# Patient Record
Sex: Female | Born: 1935 | Race: White | Hispanic: No | Smoking: Never smoker
Health system: Southern US, Community
[De-identification: ages and names within clinical notes are randomized; demographics above are authoritative.]

## PROBLEM LIST (undated history)

## (undated) DIAGNOSIS — F329 Major depressive disorder, single episode, unspecified: Secondary | ICD-10-CM

## (undated) DIAGNOSIS — R002 Palpitations: Secondary | ICD-10-CM

## (undated) DIAGNOSIS — K219 Gastro-esophageal reflux disease without esophagitis: Secondary | ICD-10-CM

## (undated) DIAGNOSIS — H9190 Unspecified hearing loss, unspecified ear: Secondary | ICD-10-CM

## (undated) DIAGNOSIS — R062 Wheezing: Secondary | ICD-10-CM

## (undated) DIAGNOSIS — E785 Hyperlipidemia, unspecified: Secondary | ICD-10-CM

## (undated) DIAGNOSIS — M810 Age-related osteoporosis without current pathological fracture: Secondary | ICD-10-CM

## (undated) DIAGNOSIS — F32A Depression, unspecified: Secondary | ICD-10-CM

## (undated) DIAGNOSIS — R600 Localized edema: Secondary | ICD-10-CM

## (undated) DIAGNOSIS — M1990 Unspecified osteoarthritis, unspecified site: Secondary | ICD-10-CM

## (undated) DIAGNOSIS — J3089 Other allergic rhinitis: Secondary | ICD-10-CM

## (undated) DIAGNOSIS — E119 Type 2 diabetes mellitus without complications: Secondary | ICD-10-CM

## (undated) DIAGNOSIS — I10 Essential (primary) hypertension: Secondary | ICD-10-CM

## (undated) DIAGNOSIS — R05 Cough: Secondary | ICD-10-CM

## (undated) DIAGNOSIS — R059 Cough, unspecified: Secondary | ICD-10-CM

## (undated) DIAGNOSIS — R0981 Nasal congestion: Secondary | ICD-10-CM

## (undated) HISTORY — PX: TOTAL SHOULDER REPLACEMENT: SUR1217

## (undated) HISTORY — PX: BREAST BIOPSY: SHX20

## (undated) HISTORY — PX: REPLACEMENT TOTAL KNEE: SUR1224

## (undated) HISTORY — PX: CHOLECYSTECTOMY: SHX55

---
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
## 2016-03-01 ENCOUNTER — Encounter: Payer: Self-pay | Admitting: *Deleted

## 2016-03-01 ENCOUNTER — Inpatient Hospital Stay
Admit: 2016-03-01 | Discharge: 2016-03-01 | Disposition: A | Discharge status: Home / Self Care | Payer: Medicare Other | Attending: Internal Medicine | Admitting: Internal Medicine

## 2016-03-01 ENCOUNTER — Observation Stay
Admission: EM | Admit: 2016-03-01 | Discharge: 2016-03-04 | Disposition: A | Discharge status: Home / Self Care | Payer: Medicare Other | Attending: Internal Medicine | Admitting: Internal Medicine

## 2016-03-01 DIAGNOSIS — A084 Viral intestinal infection, unspecified: Principal | ICD-10-CM | POA: Diagnosis present

## 2016-03-01 DIAGNOSIS — I081 Rheumatic disorders of both mitral and tricuspid valves: Secondary | ICD-10-CM | POA: Insufficient documentation

## 2016-03-01 DIAGNOSIS — I951 Orthostatic hypotension: Secondary | ICD-10-CM | POA: Diagnosis not present

## 2016-03-01 DIAGNOSIS — Z96651 Presence of right artificial knee joint: Secondary | ICD-10-CM | POA: Insufficient documentation

## 2016-03-01 DIAGNOSIS — M1990 Unspecified osteoarthritis, unspecified site: Secondary | ICD-10-CM | POA: Diagnosis not present

## 2016-03-01 DIAGNOSIS — E872 Acidosis, unspecified: Secondary | ICD-10-CM

## 2016-03-01 DIAGNOSIS — N179 Acute kidney failure, unspecified: Secondary | ICD-10-CM

## 2016-03-01 DIAGNOSIS — I10 Essential (primary) hypertension: Secondary | ICD-10-CM | POA: Diagnosis not present

## 2016-03-01 DIAGNOSIS — F419 Anxiety disorder, unspecified: Secondary | ICD-10-CM | POA: Insufficient documentation

## 2016-03-01 DIAGNOSIS — Z885 Allergy status to narcotic agent status: Secondary | ICD-10-CM | POA: Diagnosis not present

## 2016-03-01 DIAGNOSIS — R112 Nausea with vomiting, unspecified: Secondary | ICD-10-CM

## 2016-03-01 DIAGNOSIS — N390 Urinary tract infection, site not specified: Secondary | ICD-10-CM | POA: Diagnosis not present

## 2016-03-01 DIAGNOSIS — Z7982 Long term (current) use of aspirin: Secondary | ICD-10-CM | POA: Insufficient documentation

## 2016-03-01 DIAGNOSIS — K219 Gastro-esophageal reflux disease without esophagitis: Secondary | ICD-10-CM | POA: Diagnosis not present

## 2016-03-01 DIAGNOSIS — F329 Major depressive disorder, single episode, unspecified: Secondary | ICD-10-CM | POA: Diagnosis not present

## 2016-03-01 DIAGNOSIS — E785 Hyperlipidemia, unspecified: Secondary | ICD-10-CM | POA: Diagnosis not present

## 2016-03-01 DIAGNOSIS — B961 Klebsiella pneumoniae [K. pneumoniae] as the cause of diseases classified elsewhere: Secondary | ICD-10-CM | POA: Insufficient documentation

## 2016-03-01 DIAGNOSIS — R197 Diarrhea, unspecified: Secondary | ICD-10-CM

## 2016-03-01 DIAGNOSIS — Z96611 Presence of right artificial shoulder joint: Secondary | ICD-10-CM | POA: Diagnosis not present

## 2016-03-01 DIAGNOSIS — E860 Dehydration: Secondary | ICD-10-CM | POA: Insufficient documentation

## 2016-03-01 DIAGNOSIS — E876 Hypokalemia: Secondary | ICD-10-CM | POA: Insufficient documentation

## 2016-03-01 DIAGNOSIS — E538 Deficiency of other specified B group vitamins: Secondary | ICD-10-CM | POA: Diagnosis not present

## 2016-03-01 DIAGNOSIS — I959 Hypotension, unspecified: Secondary | ICD-10-CM

## 2016-03-01 HISTORY — DX: Essential (primary) hypertension: I10

## 2016-03-01 LAB — URINALYSIS COMPLETE WITH MICROSCOPIC (ARMC ONLY)
BILIRUBIN URINE: NEGATIVE
GLUCOSE, UA: NEGATIVE mg/dL
Hgb urine dipstick: NEGATIVE
Ketones, ur: NEGATIVE mg/dL
Nitrite: NEGATIVE
Protein, ur: 30 mg/dL — AB
Specific Gravity, Urine: 1.021 (ref 1.005–1.030)
pH: 5 (ref 5.0–8.0)

## 2016-03-01 LAB — COMPREHENSIVE METABOLIC PANEL
ALBUMIN: 3.7 g/dL (ref 3.5–5.0)
ALK PHOS: 44 U/L (ref 38–126)
ALT: 17 U/L (ref 14–54)
AST: 19 U/L (ref 15–41)
Anion gap: 13 (ref 5–15)
BILIRUBIN TOTAL: 0.2 mg/dL — AB (ref 0.3–1.2)
BUN: 39 mg/dL — ABNORMAL HIGH (ref 6–20)
CALCIUM: 9.2 mg/dL (ref 8.9–10.3)
CO2: 20 mmol/L — AB (ref 22–32)
CREATININE: 1.95 mg/dL — AB (ref 0.44–1.00)
Chloride: 106 mmol/L (ref 101–111)
GFR calc non Af Amer: 23 mL/min — ABNORMAL LOW (ref >60)
GFR, EST AFRICAN AMERICAN: 27 mL/min — AB (ref >60)
GLUCOSE: 130 mg/dL — AB (ref 65–99)
Potassium: 4 mmol/L (ref 3.5–5.1)
SODIUM: 139 mmol/L (ref 135–145)
TOTAL PROTEIN: 7.1 g/dL (ref 6.5–8.1)

## 2016-03-01 LAB — GASTROINTESTINAL PANEL BY PCR, STOOL (REPLACES STOOL CULTURE)
Adenovirus F40/41: NOT DETECTED
Astrovirus: DETECTED — AB
CAMPYLOBACTER SPECIES: NOT DETECTED
Cryptosporidium: NOT DETECTED
Cyclospora cayetanensis: NOT DETECTED
ENTEROAGGREGATIVE E COLI (EAEC): NOT DETECTED
Entamoeba histolytica: NOT DETECTED
Enteropathogenic E coli (EPEC): NOT DETECTED
Enterotoxigenic E coli (ETEC): NOT DETECTED
GIARDIA LAMBLIA: NOT DETECTED
NOROVIRUS GI/GII: NOT DETECTED
Plesimonas shigelloides: NOT DETECTED
ROTAVIRUS A: NOT DETECTED
SALMONELLA SPECIES: NOT DETECTED
SHIGA LIKE TOXIN PRODUCING E COLI (STEC): NOT DETECTED
SHIGELLA/ENTEROINVASIVE E COLI (EIEC): NOT DETECTED
Sapovirus (I, II, IV, and V): NOT DETECTED
Vibrio cholerae: NOT DETECTED
Vibrio species: NOT DETECTED
Yersinia enterocolitica: NOT DETECTED

## 2016-03-01 LAB — C DIFFICILE QUICK SCREEN W PCR REFLEX
C DIFFICILE (CDIFF) INTERP: NOT DETECTED
C DIFFICILE (CDIFF) TOXIN: NEGATIVE
C Diff antigen: NEGATIVE

## 2016-03-01 LAB — MAGNESIUM: MAGNESIUM: 1.1 mg/dL — AB (ref 1.7–2.4)

## 2016-03-01 LAB — TROPONIN I

## 2016-03-01 LAB — CBC
HCT: 38.4 % (ref 35.0–47.0)
Hemoglobin: 13 g/dL (ref 12.0–16.0)
MCH: 30.8 pg (ref 26.0–34.0)
MCHC: 33.7 g/dL (ref 32.0–36.0)
MCV: 91.4 fL (ref 80.0–100.0)
PLATELETS: 363 10*3/uL (ref 150–440)
RBC: 4.21 MIL/uL (ref 3.80–5.20)
RDW: 15.8 % — ABNORMAL HIGH (ref 11.5–14.5)
WBC: 7.6 10*3/uL (ref 3.6–11.0)

## 2016-03-01 LAB — LIPASE, BLOOD: Lipase: 30 U/L (ref 11–51)

## 2016-03-01 MED ORDER — ASPIRIN EC 81 MG PO TBEC
81.0000 mg | DELAYED_RELEASE_TABLET | Freq: Every day | ORAL | Status: DC
  Administered 2016-03-02 – 2016-03-04 (×3): 81 mg via ORAL
  Filled 2016-03-01 (×3): qty 1

## 2016-03-01 MED ORDER — SODIUM CHLORIDE 0.9 % IV BOLUS (SEPSIS)
1000.0000 mL | Freq: Once | INTRAVENOUS | Status: AC
  Administered 2016-03-01: 1000 mL via INTRAVENOUS

## 2016-03-01 MED ORDER — ACETAMINOPHEN 650 MG RE SUPP: 650.0000 mg | Freq: Four times a day (QID) | RECTAL | Status: DC | PRN

## 2016-03-01 MED ORDER — ALUM & MAG HYDROXIDE-SIMETH 200-200-20 MG/5ML PO SUSP
30.0000 mL | ORAL | Status: DC | PRN
  Administered 2016-03-01 – 2016-03-02 (×3): 30 mL via ORAL
  Filled 2016-03-01 (×2): qty 30

## 2016-03-01 MED ORDER — PANTOPRAZOLE SODIUM 40 MG PO TBEC: 40.0000 mg | DELAYED_RELEASE_TABLET | Freq: Every day | ORAL | Status: DC

## 2016-03-01 MED ORDER — SODIUM CHLORIDE 0.9% FLUSH
3.0000 mL | Freq: Two times a day (BID) | INTRAVENOUS | Status: DC
Start: 2016-03-01 — End: 2016-03-04
  Administered 2016-03-01 – 2016-03-04 (×3): 3 mL via INTRAVENOUS

## 2016-03-01 MED ORDER — VITAMIN B-12 100 MCG PO TABS
100.0000 ug | ORAL_TABLET | Freq: Every day | ORAL | Status: DC
  Administered 2016-03-02 – 2016-03-04 (×3): 100 ug via ORAL
  Filled 2016-03-01 (×3): qty 1

## 2016-03-01 MED ORDER — ACETAMINOPHEN 325 MG PO TABS
ORAL_TABLET | ORAL | Status: AC
  Administered 2016-03-01: 650 mg
  Filled 2016-03-01: qty 2

## 2016-03-01 MED ORDER — ONDANSETRON 4 MG PO TBDP
4.0000 mg | ORAL_TABLET | Freq: Once | ORAL | Status: AC | PRN
  Administered 2016-03-01: 4 mg via ORAL

## 2016-03-01 MED ORDER — PANTOPRAZOLE SODIUM 40 MG PO TBEC
40.0000 mg | DELAYED_RELEASE_TABLET | Freq: Every day | ORAL | Status: DC
  Administered 2016-03-01 – 2016-03-04 (×4): 40 mg via ORAL
  Filled 2016-03-01 (×4): qty 1

## 2016-03-01 MED ORDER — ONDANSETRON HCL 4 MG/2ML IJ SOLN
4.0000 mg | Freq: Once | INTRAMUSCULAR | Status: AC
  Administered 2016-03-01: 4 mg via INTRAVENOUS
  Filled 2016-03-01: qty 2

## 2016-03-01 MED ORDER — SERTRALINE HCL 50 MG PO TABS
50.0000 mg | ORAL_TABLET | Freq: Two times a day (BID) | ORAL | Status: DC
  Administered 2016-03-01 – 2016-03-04 (×6): 50 mg via ORAL
  Filled 2016-03-01 (×6): qty 1

## 2016-03-01 MED ORDER — ONDANSETRON HCL 4 MG PO TABS: 4.0000 mg | ORAL_TABLET | Freq: Four times a day (QID) | ORAL | Status: DC | PRN

## 2016-03-01 MED ORDER — ALUM & MAG HYDROXIDE-SIMETH 200-200-20 MG/5ML PO SUSP
ORAL | Status: AC
  Filled 2016-03-01: qty 30

## 2016-03-01 MED ORDER — ONDANSETRON HCL 4 MG/2ML IJ SOLN
4.0000 mg | Freq: Four times a day (QID) | INTRAMUSCULAR | Status: DC | PRN
  Administered 2016-03-02: 4 mg via INTRAVENOUS
  Filled 2016-03-01: qty 2

## 2016-03-01 MED ORDER — SIMVASTATIN 40 MG PO TABS: 40.0000 mg | ORAL_TABLET | ORAL | Status: DC

## 2016-03-01 MED ORDER — ONDANSETRON 4 MG PO TBDP
ORAL_TABLET | ORAL | Status: AC
Start: 2016-03-01 — End: 2016-03-02
  Filled 2016-03-01: qty 1

## 2016-03-01 MED ORDER — ENOXAPARIN SODIUM 40 MG/0.4ML ~~LOC~~ SOLN
40.0000 mg | SUBCUTANEOUS | Status: DC
  Administered 2016-03-01: 40 mg via SUBCUTANEOUS
  Filled 2016-03-01: qty 0.4

## 2016-03-01 MED ORDER — TRAMADOL HCL 50 MG PO TABS
100.0000 mg | ORAL_TABLET | ORAL | Status: DC
  Administered 2016-03-01 – 2016-03-04 (×6): 100 mg via ORAL
  Filled 2016-03-01 (×6): qty 2

## 2016-03-01 MED ORDER — ACETAMINOPHEN 325 MG PO TABS
650.0000 mg | ORAL_TABLET | Freq: Four times a day (QID) | ORAL | Status: DC | PRN
  Administered 2016-03-02 – 2016-03-03 (×2): 650 mg via ORAL
  Filled 2016-03-01 (×2): qty 2

## 2016-03-01 MED ORDER — CALCIUM CARBONATE-VITAMIN D 500-200 MG-UNIT PO TABS
1.0000 | ORAL_TABLET | Freq: Every day | ORAL | Status: DC
  Administered 2016-03-02 – 2016-03-04 (×3): 1 via ORAL
  Filled 2016-03-01 (×3): qty 1

## 2016-03-01 MED ORDER — SODIUM CHLORIDE 0.9 % IV SOLN
INTRAVENOUS | Status: DC
  Administered 2016-03-01 – 2016-03-03 (×6): via INTRAVENOUS

## 2016-03-01 NOTE — ED Provider Notes (Signed)
[facility] Emergency Department Provider Note   ____________________________________________   First MD Initiated Contact with Patient 03/01/16 1318     (approximate)  I have reviewed the triage vital signs and the nursing notes.   HISTORY  Chief Complaint Emesis and Diarrhea   HPI Briana Cook is a 81 y.o. female With a history of hypertension was presenting with nausea vomiting and diarrhea over the past 3 days. She says that she has vomited about 2-3 times per day but is having about 10 episodes of diarrhea per day. She denies any blood in her vomitus or stool. Denies any pain. York Spaniel that she had a UTI several weeks ago and finished course of antibiotic but does not remove them the antibiotic.Felt very weak and felt that she was going to pass outtwice yesterday. Denies any chest pain or shortness of breath.   Past Medical History:  Diagnosis Date  . Hypertension     There are no active problems to display for this patient.   Past Surgical History:  Procedure Laterality Date  . REPLACEMENT TOTAL KNEE Right   . TOTAL SHOULDER REPLACEMENT Right     Prior to Admission medications   Medication Sig Start Date End Date Taking? Authorizing Provider  aspirin EC 81 MG tablet Take 81 mg by mouth daily.   Yes Historical Provider, MD  calcium-vitamin D (OSCAL WITH D) 500-200 MG-UNIT tablet Take 1 tablet by mouth daily with breakfast.   Yes Historical Provider, MD  Cyanocobalamin (B-12 PO) Take by mouth.   Yes Historical Provider, MD  lisinopril-hydrochlorothiazide (PRINZIDE,ZESTORETIC) 20-25 MG tablet Take 1 tablet by mouth 2 (two) times daily. 03/01/16  Yes Historical Provider, MD  metoprolol succinate (TOPROL-XL) 25 MG 24 hr tablet Take 1 tablet by mouth daily. 02/20/16  Yes Historical Provider, MD  omeprazole (PRILOSEC) 20 MG capsule Take 1 capsule by mouth daily. 02/20/16  Yes Historical Provider, MD  ondansetron (ZOFRAN) 4 MG tablet Take 1 tablet  by mouth 3 (three) times daily as needed. 01/19/16  Yes Historical Provider, MD  sertraline (ZOLOFT) 100 MG tablet Take 0.5 tablets by mouth 2 (two) times daily. 02/19/16  Yes Historical Provider, MD  simvastatin (ZOCOR) 40 MG tablet Take 1 tablet by mouth every morning. 02/20/16  Yes Historical Provider, MD  traMADol (ULTRAM) 50 MG tablet Take 2 tablets by mouth 2 (two) times daily in the am and at bedtime.. 01/26/16  Yes Historical Provider, MD    Allergies Oxycodone  History reviewed. No pertinent family history.  Social History Social History  Substance Use Topics  . Smoking status: Never Smoker  . Smokeless tobacco: Never Used  . Alcohol use Yes     Comment: occasionally    Review of Systems Constitutional: No fever/chills Eyes: No visual changes. ENT: No sore throat. Cardiovascular: Denies chest pain. Respiratory: Denies shortness of breath. Gastrointestinal: No abdominal pain.   No constipation. Genitourinary: Negative for dysuria. Musculoskeletal: Negative for back pain. Skin: Negative for rash. Neurological: Negative for headaches, focal weakness or numbness.  10-point ROS otherwise negative.  ____________________________________________   PHYSICAL EXAM:  VITAL SIGNS: ED Triage Vitals  Enc Vitals Group     BP 03/01/16 1212 (!) 87/44     Pulse Rate 03/01/16 1212 93     Resp 03/01/16 1212 16     Temp 03/01/16 1212 97.2 F (36.2 C)     Temp Source 03/01/16 1212 Oral     SpO2 03/01/16 1212 100 %  Weight 03/01/16 1214 189 lb (85.7 kg)     Height 03/01/16 1214 5\' 4"  (1.626 m)     Head Circumference --      Peak Flow --      Pain Score --      Pain Loc --      Pain Edu? --      Excl. in GC? --     Constitutional: Alert and oriented. Well appearing and in no acute distress. Eyes: Conjunctivae are normal. PERRL. EOMI. Head: Atraumatic. Nose: No congestion/rhinnorhea. Mouth/Throat: Mucous membranes are moist.   Neck: No stridor.   Cardiovascular:  Normal rate, regular rhythm. Grossly normal heart sounds.  Good peripheral circulation. Respiratory: Normal respiratory effort.  No retractions. Lungs CTAB. Gastrointestinal: Soft and nontender. No distention.  Musculoskeletal: No lower extremity tenderness nor edema.  No joint effusions. Neurologic:  Normal speech and language. No gross focal neurologic deficits are appreciated. No gait instability. Skin:  Skin is warm, dry and intact. No rash noted. Psychiatric: Mood and affect are normal. Speech and behavior are normal.  ____________________________________________   LABS (all labs ordered are listed, but only abnormal results are displayed)  Labs Reviewed  GASTROINTESTINAL PANEL BY PCR, STOOL (REPLACES STOOL CULTURE) - Abnormal; Notable for the following:       Result Value   Astrovirus DETECTED (*)    All other components within normal limits  COMPREHENSIVE METABOLIC PANEL - Abnormal; Notable for the following:    CO2 20 (*)    Glucose, Bld 130 (*)    BUN 39 (*)    Creatinine, Ser 1.95 (*)    Total Bilirubin 0.2 (*)    GFR calc non Af Amer 23 (*)    GFR calc Af Amer 27 (*)    All other components within normal limits  CBC - Abnormal; Notable for the following:    RDW 15.8 (*)    All other components within normal limits  URINALYSIS COMPLETEWITH MICROSCOPIC ([facility] ONLY) - Abnormal; Notable for the following:    Color, Urine AMBER (*)    APPearance HAZY (*)    Protein, ur 30 (*)    Leukocytes, UA 2+ (*)    Bacteria, UA MANY (*)    Squamous Epithelial / LPF 0-5 (*)    All other components within normal limits  C DIFFICILE QUICK SCREEN W PCR REFLEX  URINE CULTURE  LIPASE, BLOOD  TROPONIN I   ____________________________________________  EKG   ED ECG REPORT I, Arelia LongestSchaevitz,  Zakry Caso M, the attending physician, personally viewed and interpreted this ECG.   Date: 03/01/2016  EKG Time: 1340  Rate: 67  Rhythm: normal sinus rhythmwith a sinus arrhythmia  Axis: normal   Intervals:none  ST&T Change: diffuse T-wave inversions in 2, 3, aVF as well as V3 through V6. No ST elevation. No previous EKGs for comparison on the record.  ____________________________________________  RADIOLOGY   ____________________________________________   PROCEDURES  Procedure(s) performed:   Procedures  Critical Care performed:   ____________________________________________   INITIAL IMPRESSION / ASSESSMENT AND PLAN / ED COURSE  Pertinent labs & imaging results that were available during my care of the patient were reviewed by me and considered in my medical decision making (see chart for details).    Clinical Course  Comment By Time  Patient with improved symptoms and has not had any bouts of diarrhea since giving her stool sample. Also without any nausea or vomiting at this time. We'll by mouth challenge. Myrna Blazeravid Matthew Helayne Metsker, MD 10/25 1617   -----------------------------------------  5:11 PM on 03/01/2016 -----------------------------------------  Patient able to tolerate juice and a small amount of crackers. Discussed possible discharge to home with the patient and the patient's granddaughter but the grandmother says that she is the patient's primary care taker and does not think that she will be able to handle the responsibility of making sure that she is able to walk safely and also supervising her enough in case she has another near syncopal or syncopal episode. Patient also with possible signs of UTI on her urinalysis but without any clinical signs. She has been treated for a UTI several weeks ago. We'll send culture but will not treat at this time. I also discussed the likelihood that this would qualify as only an observation admission and the family understands and is willing to comply. The patient will be admitted to the hospital. Signed out to Dr. Allena Katz.  ____________________________________________   FINAL CLINICAL IMPRESSION(S) / ED  DIAGNOSES  Nausea vomiting and diarrhea. Acute kidney injury.    NEW MEDICATIONS STARTED DURING THIS VISIT:  New Prescriptions   No medications on file     Note:  This document was prepared using Dragon voice recognition software and may include unintentional dictation errors.    Myrna Blazer, MD 03/01/16 [phone]

## 2016-03-01 NOTE — Progress Notes (Signed)
Pt came to floor at 1844. VSS. Pt was oriented to room and safety plan. Granddaughter at bedside. Report was taken from Carolan ShiverLaura Cates, RN. PM night RN will take over care.

## 2016-03-01 NOTE — ED Notes (Signed)
Pt given crackers and juice by MD. Pt denies nausea or abdominal pain at this time.

## 2016-03-01 NOTE — ED Triage Notes (Signed)
Pt arrived to ED from home reports NVD since Monday. No fevers reported. Pt reports decreased appetite and weakness over the past 3 days. Pt reports being unable to Pedialyte down this morning. Pt in NAD at this time and has no neuro deficits noted.

## 2016-03-01 NOTE — H&P (Addendum)
[facility] [employer] at [facility]   PATIENT NAME: Briana Cook    MR#:  [id number]  DATE OF BIRTH:  Nov 15, 1934  DATE OF ADMISSION:  03/01/2016  PRIMARY CARE PHYSICIAN: No PCP Per Patient   REQUESTING/REFERRING PHYSICIAN:   CHIEF COMPLAINT:   Chief Complaint  Patient presents with  . Emesis  . Diarrhea    HISTORY OF PRESENT ILLNESS: Briana Cook  is a 81 y.o. female with a known history of Essential hypertension, vitamin B12 deficiency, gastroesophageal reflux disease, anxiety, depression, hyperlipidemia, who presents to the hospital with complaints of 3-4 day history of nausea, vomiting and diarrhea, weakness, dizziness. When the patient, she was doing well up until 3 or 4 days ago when she started having nausea and diarrhea, vomiting happened over the past 24 hours. Patient has been having loose stools about every 20 minutes. She had 2 syncopal episodes and falls earlier today and was brought to emergency room for further evaluation and treatment. In emergency room, she was noted to have acute renal failure with creatinine of 1.9, hypotension with systolic blood pressure of 90s on arrival, stool cultures revealed as to virus. Hospitalist services were contacted for admission  PAST MEDICAL HISTORY:   Past Medical History:  Diagnosis Date  . Hypertension     PAST SURGICAL HISTORY: Past Surgical History:  Procedure Laterality Date  . REPLACEMENT TOTAL KNEE Right   . TOTAL SHOULDER REPLACEMENT Right     SOCIAL HISTORY:  Social History  Substance Use Topics  . Smoking status: Never Smoker  . Smokeless tobacco: Never Used  . Alcohol use Yes     Comment: occasionally    FAMILY HISTORY: History reviewed. No pertinent family history.  DRUG ALLERGIES:  Allergies  Allergen Reactions  . Oxycodone Other (See Comments)    Pt reports "It makes me crazy"    Review of Systems  Constitutional: Negative for chills, fever and weight loss.  HENT: Negative  for congestion.   Eyes: Negative for blurred vision and double vision.  Respiratory: Negative for cough, sputum production, shortness of breath and wheezing.   Cardiovascular: Negative for chest pain, palpitations, orthopnea, leg swelling and PND.  Gastrointestinal: Positive for abdominal pain, diarrhea, nausea and vomiting. Negative for blood in stool and constipation.  Genitourinary: Positive for dysuria. Negative for frequency, hematuria and urgency.  Musculoskeletal: Negative for falls.  Neurological: Negative for dizziness, tremors, focal weakness and headaches.  Endo/Heme/Allergies: Does not bruise/bleed easily.  Psychiatric/Behavioral: Negative for depression. The patient does not have insomnia.     MEDICATIONS AT HOME:  Prior to Admission medications   Medication Sig Start Date End Date Taking? Authorizing Provider  aspirin EC 81 MG tablet Take 81 mg by mouth daily.   Yes Historical Provider, MD  calcium-vitamin D (OSCAL WITH D) 500-200 MG-UNIT tablet Take 1 tablet by mouth daily with breakfast.   Yes Historical Provider, MD  Cyanocobalamin (B-12 PO) Take by mouth.   Yes Historical Provider, MD  lisinopril-hydrochlorothiazide (PRINZIDE,ZESTORETIC) 20-25 MG tablet Take 1 tablet by mouth 2 (two) times daily. 03/01/16  Yes Historical Provider, MD  metoprolol succinate (TOPROL-XL) 25 MG 24 hr tablet Take 1 tablet by mouth daily. 02/20/16  Yes Historical Provider, MD  omeprazole (PRILOSEC) 20 MG capsule Take 1 capsule by mouth daily. 02/20/16  Yes Historical Provider, MD  ondansetron (ZOFRAN) 4 MG tablet Take 1 tablet by mouth 3 (three) times daily as needed. 01/19/16  Yes Historical Provider, MD  sertraline (ZOLOFT) 100 MG tablet  Take 0.5 tablets by mouth 2 (two) times daily. 02/19/16  Yes Historical Provider, MD  simvastatin (ZOCOR) 40 MG tablet Take 1 tablet by mouth every morning. 02/20/16  Yes Historical Provider, MD  traMADol (ULTRAM) 50 MG tablet Take 2 tablets by mouth 2 (two) times  daily in the am and at bedtime.. 01/26/16  Yes Historical Provider, MD      PHYSICAL EXAMINATION:   VITAL SIGNS: Blood pressure 122/68, pulse 75, temperature 97.2 F (36.2 C), temperature source Oral, resp. rate 13, height 5\' 4"  (1.626 m), weight 85.7 kg (189 lb), SpO2 94 %.  GENERAL:  81 y.o.-year-old patient lying in the bed with no acute distress. Heart hearing EYES: Pupils equal, round, reactive to light and accommodation. No scleral icterus. Extraocular muscles intact.  HEENT: Head atraumatic, normocephalic. Oropharynx and nasopharynx clear.  NECK:  Supple, no jugular venous distention. No thyroid enlargement, no tenderness.  LUNGS: Some diminished breath sounds bilaterally, no wheezing, rales,rhonchi or crepitation. No use of accessory muscles of respiration.  CARDIOVASCULAR: S1, S2 normal. No murmurs, rubs, or gallops.  ABDOMEN: Soft, Mild discomfort in the abdomen, mostly on the right side, but no rebound or guarding was noted, nondistended. Bowel sounds were diminished. No organomegaly or mass.  EXTREMITIES: No pedal edema, cyanosis, or clubbing.  NEUROLOGIC: Cranial nerves II through XII are intact. Muscle strength 5/5 in all extremities. Sensation intact. Gait not checked.  PSYCHIATRIC: The patient is alert and oriented x 3.  SKIN: No obvious rash, lesion, or ulcer.   LABORATORY PANEL:   CBC  Recent Labs Lab 03/01/16 1214  WBC 7.6  HGB 13.0  HCT 38.4  PLT 363  MCV 91.4  MCH 30.8  MCHC 33.7  RDW 15.8*   ------------------------------------------------------------------------------------------------------------------  Chemistries   Recent Labs Lab 03/01/16 1214  NA 139  K 4.0  CL 106  CO2 20*  GLUCOSE 130*  BUN 39*  CREATININE 1.95*  CALCIUM 9.2  AST 19  ALT 17  ALKPHOS 44  BILITOT 0.2*   ------------------------------------------------------------------------------------------------------------------  Cardiac Enzymes  Recent Labs Lab  03/01/16 1228  TROPONINI <0.03   ------------------------------------------------------------------------------------------------------------------  RADIOLOGY: No results found.  EKG: Orders placed or performed during the hospital encounter of 03/01/16  . ED EKG  . ED EKG   EKG in the ED showed sinus arrhythmia, rate of 67, abnormal R-wave progression, early transition, T inversion in multiple leads, including inferior and lateral   IMPRESSION AND PLAN:  Active Problems:   Viral enteritis   Hypotension   Acute renal failure (ARF) (HCC)   Acidosis   UTI (urinary tract infection)   Viral gastroenteritis #1. Acute viral gastroenteritis. Admit patient to medical floor, enteric precautions, initiate symptomatic therapy with IV fluids, antiemetics, clear liquid diet, follow clinically #2. Acute renal failure, suspend ACE inhibitor, diuretic, initiate IV fluids, follow creatinine in the morning, get renal ultrasound  #3. Hypotension, continue IV fluids, hold blood pressure medications #4. Pyuria, questionable urinary tract infection, patient received 2 rounds of antibiotic therapy recently, get urine cultures and initiate antibiotics when culture results are known, patient is relatively asymptomatic #5. Abnormal EKG, syncope episodes, get echocardiogram, holding metoprolol until blood pressure improves  All the records are reviewed and case discussed with ED provider. Management plans discussed with the patient, family and they are in agreement.  CODE STATUS: Code Status History    This patient does not have a recorded code status. Please follow your organizational policy for patients in this situation.    Advance Directive  Documentation   Flowsheet Row Most Recent Value  Type of Advance Directive  Healthcare Power of Attorney [Machael Laufer]  Pre-existing out of facility DNR order (yellow form or pink MOST form)  No data  "MOST" Form in Place?  No data       TOTAL TIME  TAKING CARE OF THIS PATIENT: 50 minutes.    Katharina CaperVAICKUTE,Jonmichael Beadnell M.D on 03/01/2016 at 5:39 PM  Between 7am to 6pm - Pager - [phone] After 6pm go to [email or web address] - password [email or web address] [facility]  [facility]  Office  [phone]  CC: Primary care physician; No PCP Per Patient

## 2016-03-02 ENCOUNTER — Observation Stay: Payer: Medicare Other

## 2016-03-02 LAB — ECHOCARDIOGRAM COMPLETE
HEIGHTINCHES: 64 in
Weight: 3024 oz

## 2016-03-02 LAB — TROPONIN I: Troponin I: <0.03 ng/mL (ref <0.03)

## 2016-03-02 LAB — HEMOGLOBIN A1C
Hgb A1c MFr Bld: 5.7 % — ABNORMAL HIGH (ref 4.8–5.6)
MEAN PLASMA GLUCOSE: 117 mg/dL

## 2016-03-02 LAB — CBC
HEMATOCRIT: 32.3 % — AB (ref 35.0–47.0)
HEMOGLOBIN: 10.8 g/dL — AB (ref 12.0–16.0)
MCH: 30.9 pg (ref 26.0–34.0)
MCHC: 33.5 g/dL (ref 32.0–36.0)
MCV: 92.2 fL (ref 80.0–100.0)
Platelets: 245 10*3/uL (ref 150–440)
RBC: 3.5 MIL/uL — ABNORMAL LOW (ref 3.80–5.20)
RDW: 15.9 % — ABNORMAL HIGH (ref 11.5–14.5)
WBC: 5.3 10*3/uL (ref 3.6–11.0)

## 2016-03-02 LAB — BASIC METABOLIC PANEL
ANION GAP: 8 (ref 5–15)
BUN: 34 mg/dL — ABNORMAL HIGH (ref 6–20)
CO2: 19 mmol/L — ABNORMAL LOW (ref 22–32)
Calcium: 7.9 mg/dL — ABNORMAL LOW (ref 8.9–10.3)
Chloride: 112 mmol/L — ABNORMAL HIGH (ref 101–111)
Creatinine, Ser: 1.3 mg/dL — ABNORMAL HIGH (ref 0.44–1.00)
GFR calc Af Amer: 44 mL/min — ABNORMAL LOW (ref >60)
GFR, EST NON AFRICAN AMERICAN: 38 mL/min — AB (ref >60)
Glucose, Bld: 97 mg/dL (ref 65–99)
POTASSIUM: 3.4 mmol/L — AB (ref 3.5–5.1)
SODIUM: 139 mmol/L (ref 135–145)

## 2016-03-02 LAB — GLUCOSE, CAPILLARY: Glucose-Capillary: 93 mg/dL (ref 65–99)

## 2016-03-02 MED ORDER — ENSURE ENLIVE PO LIQD
237.0000 mL | Freq: Every day | ORAL | Status: DC
  Administered 2016-03-03: 237 mL via ORAL

## 2016-03-02 MED ORDER — LOPERAMIDE HCL 2 MG PO CAPS
2.0000 mg | ORAL_CAPSULE | Freq: Four times a day (QID) | ORAL | Status: DC | PRN
  Administered 2016-03-02 – 2016-03-03 (×4): 2 mg via ORAL
  Filled 2016-03-02 (×4): qty 1

## 2016-03-02 MED ORDER — SIMVASTATIN 40 MG PO TABS
40.0000 mg | ORAL_TABLET | Freq: Every morning | ORAL | Status: DC
  Administered 2016-03-02 – 2016-03-04 (×3): 40 mg via ORAL
  Filled 2016-03-02 (×3): qty 1

## 2016-03-02 MED ORDER — ENOXAPARIN SODIUM 40 MG/0.4ML ~~LOC~~ SOLN
40.0000 mg | SUBCUTANEOUS | Status: DC
  Administered 2016-03-02 – 2016-03-03 (×2): 40 mg via SUBCUTANEOUS
  Filled 2016-03-02 (×2): qty 0.4

## 2016-03-02 MED ORDER — ENOXAPARIN SODIUM 30 MG/0.3ML ~~LOC~~ SOLN: 30.0000 mg | SUBCUTANEOUS | Status: DC

## 2016-03-02 NOTE — Progress Notes (Signed)
Initial Nutrition Assessment  DOCUMENTATION CODES:   Obesity unspecified  INTERVENTION:  -Discussed nutritional supplementation until appetite improves; pt agreeable. Recommend EnsureEnlive daily.   NUTRITION DIAGNOSIS:   Inadequate oral intake related to altered GI function, acute illness as evidenced by per patient/family report.  GOAL:   Patient will meet greater than or equal to 90% of their needs  MONITOR:   Diet advancement, PO intake, Supplement acceptance, Labs, Weight trends  REASON FOR ASSESSMENT:   Malnutrition Screening Tool    ASSESSMENT:    81 yo female admitted with N/V/D with viral gastroenteritis, ARF  Pt reports good appetite up until Sunday evening when diarrhea started. Pt with few episodes of N/V as well. Minimal po intake since Sunday and whatever she did eat just "ran right through." Pt reports no wt loss.   Pt feeling much better today. No N/V today +1 loose stool. Tolerated CL diet well at Breakfast and Lunch, diet advanced to FL.  Nutrition-Focused physical exam completed. Findings are WDL for fat depletion, muscle depletion, and edema.    Past Medical History:  Diagnosis Date  . Hypertension      Diet Order:  Diet full liquid Room service appropriate? Yes; Fluid consistency: Thin  Skin:  Reviewed, no issues  Last BM:  10/26 diarrhea improving   Labs: potassium 3.4, magnesium 1.1  Meds: NS at 125 ml/hr  Height:   Ht Readings from Last 1 Encounters:  03/01/16 5\' 4"  (1.626 m)    Weight:   Wt Readings from Last 1 Encounters:  03/01/16 189 lb (85.7 kg)    Ideal Body Weight:  54.5 kg  BMI:  Body mass index is 32.44 kg/m.  Estimated Nutritional Needs:   Kcal:  1600-1800 kcals  Protein:  86-95 g  Fluid:  >/= 1.8 L  EDUCATION NEEDS:   No education needs identified at this time  Romelle StarcherCate Jorrell Kuster MS, RD, LDN [phone] Pager  [phone] Weekend/On-Call Pager

## 2016-03-02 NOTE — Care Management Obs Status (Signed)
MEDICARE OBSERVATION STATUS NOTIFICATION   Patient Details  Name: Briana Cook MRN: [id number] Date of Birth: 08-09-1934   Medicare Observation Status Notification Given:  Yes No patient signature because the patient is in Isolation    [name], RN 03/02/2016, 9:12 AM

## 2016-03-02 NOTE — Progress Notes (Signed)
Anticoagulation monitoring(Lovenox):  81yo  female ordered Lovenox for DVT prevention. Currently ordered 30mg  q24h for renal adjustment.  Filed Weights   03/01/16 1214  Weight: 189 lb (85.7 kg)    Lab Results  Component Value Date   CREATININE 1.30 (H) 03/02/2016   CREATININE 1.95 (H) 03/01/2016   Estimated Creatinine Clearance: 37.2 mL/min (by C-G formula based on SCr of 1.3 mg/dL (H)). Hemoglobin & Hematocrit     Component Value Date/Time   HGB 10.8 (L) 03/02/2016 0720   HCT 32.3 (L) 03/02/2016 0720   Renal function has improved, est. CrCl =37.2 ml/min  Per Protocol for Patient with estCrcl > 30 ml/min and BMI < 40, will transition to Lovenox 40 mg Q24h.     Clovia CuffLisa Tiare Rohlman, PharmD, BCPS 03/02/2016 9:54 AM

## 2016-03-02 NOTE — Progress Notes (Signed)
[facility] - [place] at [facility]   PATIENT NAME: Briana Cook [place]    MR#:  [id number]  DATE OF BIRTH:  1934-12-06  SUBJECTIVE:   Patient here due to nausea vomiting and diarrhea noted to have acute viral gastroenteritis. Also noted to be in acute kidney injury. Still having some diarrhea but improved. Overall feels better since yesterday.  REVIEW OF SYSTEMS:    Review of Systems  Constitutional: Negative for chills and fever.  HENT: Negative for congestion and tinnitus.   Eyes: Negative for blurred vision and double vision.  Respiratory: Negative for cough, shortness of breath and wheezing.   Cardiovascular: Negative for chest pain, orthopnea and PND.  Gastrointestinal: Positive for diarrhea. Negative for abdominal pain, nausea and vomiting.  Genitourinary: Negative for dysuria and hematuria.  Neurological: Positive for weakness (generalized). Negative for dizziness, sensory change and focal weakness.  All other systems reviewed and are negative.   Nutrition: Full liquids Tolerating Diet: Yes Tolerating PT: Await eval.      DRUG ALLERGIES:   Allergies  Allergen Reactions  . Oxycodone Other (See Comments)    Pt reports "It makes me crazy"    VITALS:  Blood pressure (!) 155/65, pulse 86, temperature 97.8 F (36.6 C), temperature source Oral, resp. rate 18, height 5\' 4"  (1.626 m), weight 85.7 kg (189 lb), SpO2 96 %.  PHYSICAL EXAMINATION:   Physical Exam  GENERAL:  81 y.o.-year-old patient lying in the bed in no acute distress.  EYES: Pupils equal, round, reactive to light and accommodation. No scleral icterus. Extraocular muscles intact.  HEENT: Head atraumatic, normocephalic. Oropharynx and nasopharynx clear.  NECK:  Supple, no jugular venous distention. No thyroid enlargement, no tenderness.  LUNGS: Normal breath sounds bilaterally, no wheezing, rales, rhonchi. No use of accessory muscles of respiration.  CARDIOVASCULAR: S1, S2 normal. No murmurs,  rubs, or gallops.  ABDOMEN: Soft, nontender, nondistended. Bowel sounds present. No organomegaly or mass.  EXTREMITIES: No cyanosis, clubbing or edema b/l.    NEUROLOGIC: Cranial nerves II through XII are intact. No focal Motor or sensory deficits b/l.   PSYCHIATRIC: The patient is alert and oriented x 3.  SKIN: No obvious rash, lesion, or ulcer.    LABORATORY PANEL:   CBC  Recent Labs Lab 03/02/16 0720  WBC 5.3  HGB 10.8*  HCT 32.3*  PLT 245   ------------------------------------------------------------------------------------------------------------------  Chemistries   Recent Labs Lab 03/01/16 1214 03/01/16 1735 03/02/16 0720  NA 139  --  139  K 4.0  --  3.4*  CL 106  --  112*  CO2 20*  --  19*  GLUCOSE 130*  --  97  BUN 39*  --  34*  CREATININE 1.95*  --  1.30*  CALCIUM 9.2  --  7.9*  MG  --  1.1*  --   AST 19  --   --   ALT 17  --   --   ALKPHOS 44  --   --   BILITOT 0.2*  --   --    ------------------------------------------------------------------------------------------------------------------  Cardiac Enzymes  Recent Labs Lab 03/02/16 0720  TROPONINI <0.03   ------------------------------------------------------------------------------------------------------------------  RADIOLOGY:  No results found.   ASSESSMENT AND PLAN:   81 year old female with past medical history of essential hypertension, hyperlipidemia, GERD, depression, osteoarthritis who presented to the hospital due to nausea vomiting diarrhea. Next  1. Acute gastroenteritis-viral in nature. Patient's gastrointestinal PCR is + for astrovirus.  - cont. Supportive care w/ IV fluids, anti-emetics, started on Imodium  today.  - tolerating clear liquids and will advance to full liquid diet.   2. Acute kidney injury-secondary to the nausea vomiting and diarrhea. -Continue IV fluids and creatinines improving.  3. GERD-continue Protonix.  4. Hyperlipidemia-continue Zocor.  5.  Anxiety/depression-continue Zoloft.  6. Hypokalemia-secondary to diarrhea. Continue supplementing. Repeat Level in the morning.    All the records are reviewed and case discussed with Care Management/Social Worker. Management plans discussed with the patient, family and they are in agreement.  CODE STATUS: Full   DVT Prophylaxis: Lovenox  TOTAL TIME TAKING CARE OF THIS PATIENT: 30 minutes.   POSSIBLE D/C IN 1-2 DAYS, DEPENDING ON CLINICAL CONDITION.   Houston Siren M.D on 03/02/2016 at 12:40 PM  Between 7am to 6pm - Pager - [phone]  After 6pm go to [email or web address] - Scientist, research (life sciences) [place] [facility]  Office  [phone]  CC: Primary care physician; No PCP Per Patient

## 2016-03-03 LAB — BASIC METABOLIC PANEL
ANION GAP: 7 (ref 5–15)
BUN: 18 mg/dL (ref 6–20)
CO2: 21 mmol/L — ABNORMAL LOW (ref 22–32)
CREATININE: 0.86 mg/dL (ref 0.44–1.00)
Calcium: 8.1 mg/dL — ABNORMAL LOW (ref 8.9–10.3)
Chloride: 111 mmol/L (ref 101–111)
GFR calc non Af Amer: >60 mL/min (ref >60)
Glucose, Bld: 103 mg/dL — ABNORMAL HIGH (ref 65–99)
POTASSIUM: 3.4 mmol/L — AB (ref 3.5–5.1)
SODIUM: 139 mmol/L (ref 135–145)

## 2016-03-03 LAB — GLUCOSE, CAPILLARY: Glucose-Capillary: 101 mg/dL — ABNORMAL HIGH (ref 65–99)

## 2016-03-03 MED ORDER — POTASSIUM CHLORIDE CRYS ER 20 MEQ PO TBCR
40.0000 meq | EXTENDED_RELEASE_TABLET | Freq: Once | ORAL | Status: AC
  Administered 2016-03-03: 40 meq via ORAL
  Filled 2016-03-03: qty 2

## 2016-03-03 NOTE — Progress Notes (Signed)
[facility] - [place] at [facility]   PATIENT NAME: Trachelle Low    MR#:  [id number]  DATE OF BIRTH:  1934-08-16  SUBJECTIVE:   Patient is here due to diarrhea from viral gastroenteritis. Continues to have ongoing diarrhea. No nausea or vomiting. Remains orthostatic. Family at bedside today.  REVIEW OF SYSTEMS:    Review of Systems  Constitutional: Negative for chills and fever.  HENT: Negative for congestion and tinnitus.   Eyes: Negative for blurred vision and double vision.  Respiratory: Negative for cough, shortness of breath and wheezing.   Cardiovascular: Negative for chest pain, orthopnea and PND.  Gastrointestinal: Positive for diarrhea. Negative for abdominal pain, nausea and vomiting.  Genitourinary: Negative for dysuria and hematuria.  Neurological: Positive for weakness (generalized). Negative for dizziness, sensory change and focal weakness.  All other systems reviewed and are negative.   Nutrition: Soft diet Tolerating Diet: Yes Tolerating PT: Await eval.     DRUG ALLERGIES:   Allergies  Allergen Reactions  . Oxycodone Other (See Comments)    Pt reports "It makes me crazy"    VITALS:  Blood pressure (!) 98/54, pulse (!) 102, temperature 98.2 F (36.8 C), temperature source Oral, resp. rate 17, height 5\' 4"  (1.626 m), weight 82.3 kg (181 lb 8 oz), SpO2 98 %.  PHYSICAL EXAMINATION:   Physical Exam  GENERAL:  81 y.o.-year-old patient lying in the bed in no acute distress.  EYES: Pupils equal, round, reactive to light and accommodation. No scleral icterus. Extraocular muscles intact.  HEENT: Head atraumatic, normocephalic. Oropharynx and nasopharynx clear.  NECK:  Supple, no jugular venous distention. No thyroid enlargement, no tenderness.  LUNGS: Normal breath sounds bilaterally, no wheezing, rales, rhonchi. No use of accessory muscles of respiration.  CARDIOVASCULAR: S1, S2 normal. II/VI SEM at RSB, No rubs, or gallops.  ABDOMEN: Soft,  nontender, nondistended. Bowel sounds present. No organomegaly or mass.  EXTREMITIES: No cyanosis, clubbing or edema b/l.    NEUROLOGIC: Cranial nerves II through XII are intact. No focal Motor or sensory deficits b/l.   PSYCHIATRIC: The patient is alert and oriented x 3.  SKIN: No obvious rash, lesion, or ulcer.    LABORATORY PANEL:   CBC  Recent Labs Lab 03/02/16 0720  WBC 5.3  HGB 10.8*  HCT 32.3*  PLT 245   ------------------------------------------------------------------------------------------------------------------  Chemistries   Recent Labs Lab 03/01/16 1214 03/01/16 1735  03/03/16 0912  NA 139  --   < > 139  K 4.0  --   < > 3.4*  CL 106  --   < > 111  CO2 20*  --   < > 21*  GLUCOSE 130*  --   < > 103*  BUN 39*  --   < > 18  CREATININE 1.95*  --   < > 0.86  CALCIUM 9.2  --   < > 8.1*  MG  --  1.1*  --   --   AST 19  --   --   --   ALT 17  --   --   --   ALKPHOS 44  --   --   --   BILITOT 0.2*  --   --   --   < > = values in this interval not displayed. ------------------------------------------------------------------------------------------------------------------  Cardiac Enzymes  Recent Labs Lab 03/02/16 0720  TROPONINI <0.03   ------------------------------------------------------------------------------------------------------------------  RADIOLOGY:  US Renal  Result Date: 03/02/2016 CLINICAL DATA:  81 y/o  F; acute renal  failure. EXAM: RENAL / URINARY TRACT ULTRASOUND COMPLETE COMPARISON:  None. FINDINGS: Right Kidney: Length: 10.5 cm. Normal echogenicity. No hydronephrosis. Mildly lobulated contour. Well-circumscribed hypoechoic 2.0 x 2.2 x 2 0 cm lower pole lesion with enhanced through transmission compatible with simple cyst. Left Kidney: Length: 9.3 cm. Echogenicity within normal limits. No mass or hydronephrosis visualized. Mildly lobulated contour. Bladder: Appears normal for degree of bladder distention. IMPRESSION: No acute process  identified.  Unremarkable renal ultrasound. Electronically Signed   By: Mitzi HansenLance  Furusawa-Stratton M.D.   On: 03/02/2016 13:41     ASSESSMENT AND PLAN:   81 year old female with past medical history of essential hypertension, hyperlipidemia, GERD, depression, osteoarthritis who presented to the hospital due to nausea vomiting diarrhea. Next  1. Acute gastroenteritis-viral in nature. Patient's gastrointestinal PCR is + for astrovirus.  - cont. Supportive care w/ IV fluids, anti-emetics, Imodium.   - tolerating full liquids and will advance to soft diet and monitor.   2. Orthostatic Hypotension - due to dehydration and ongoing diarrhea.  - cont. IV fluids and will repeat in a.m.   3. Acute kidney injury-secondary to the nausea vomiting and diarrhea. - improved and normalized w/ IV fluids.    3. GERD-continue Protonix.  4. Hyperlipidemia-continue Zocor.  5. Anxiety/depression-continue Zoloft.  6. Hypokalemia-secondary to diarrhea.  - cont. To supplement and will repeat in a.m.    All the records are reviewed and case discussed with Care Management/Social Worker. Management plans discussed with the patient, family and they are in agreement.  CODE STATUS: Full   DVT Prophylaxis: Lovenox  TOTAL TIME TAKING CARE OF THIS PATIENT: 25 minutes.   POSSIBLE D/C IN 1-2 DAYS, DEPENDING ON CLINICAL CONDITION.   Houston SirenSAINANI,Baird Polinski J M.D on 03/03/2016 at 2:13 PM  Between 7am to 6pm - Pager - [phone]  After 6pm go to [email or web address] - Scientist, research (life sciences)password [email or web address]  [facility] [place] [facility]  Office  [phone]  CC: Primary care physician; No PCP Per Patient

## 2016-03-04 LAB — GLUCOSE, CAPILLARY: Glucose-Capillary: 90 mg/dL (ref 65–99)

## 2016-03-04 LAB — BASIC METABOLIC PANEL
Anion gap: 6 (ref 5–15)
BUN: 13 mg/dL (ref 6–20)
CO2: 20 mmol/L — ABNORMAL LOW (ref 22–32)
Calcium: 8.1 mg/dL — ABNORMAL LOW (ref 8.9–10.3)
Chloride: 113 mmol/L — ABNORMAL HIGH (ref 101–111)
Creatinine, Ser: 0.85 mg/dL (ref 0.44–1.00)
GFR calc Af Amer: >60 mL/min (ref >60)
GFR calc non Af Amer: >60 mL/min (ref >60)
Glucose, Bld: 97 mg/dL (ref 65–99)
Potassium: 3.5 mmol/L (ref 3.5–5.1)
Sodium: 139 mmol/L (ref 135–145)

## 2016-03-04 LAB — URINE CULTURE

## 2016-03-04 MED ORDER — CEPHALEXIN 250 MG PO CAPS: 250.0000 mg | ORAL_CAPSULE | Freq: Three times a day (TID) | ORAL | 0 refills | Status: DC

## 2016-03-04 MED ORDER — SODIUM CHLORIDE 0.9 % IV BOLUS (SEPSIS)
1000.0000 mL | Freq: Once | INTRAVENOUS | Status: AC
  Administered 2016-03-04: 1000 mL via INTRAVENOUS

## 2016-03-04 NOTE — Progress Notes (Signed)
Spoke with Dr. Winona LegatoVaickute and gave her the patient's orthostatic vitals. Per MD okay for patinet to be discharged at this time.

## 2016-03-04 NOTE — Evaluation (Signed)
Physical Therapy Evaluation Patient Details Name: Briana ReichmannMary Joann Cook MRN: [id number] DOB: [birth year] Today's Date: 03/04/2016   History of Present Illness  Patient is a pleasant 81 y/o female that presents with multiple days of vomiting, diarrhea, and feeling of weakness. She has had 2 syncopal episodes, though no overt falls. She has been orthostatic since her admission.   Clinical Impression  Patient reported to have multiple episodes of nausea, diarrhea, vomiting, resulting in feeling weak. She demonstrates fairly significant orthostatic drop in BP initially with standing, though asymptomatic. Patient demonstrates increase in BP with increased time in standing, again with no symptoms. She is able to ambulate household distance with RW with good speed, no overt loss of balance. Patient appears to be regaining strength in LEs, though given her new reliance on RW (had been fairly independent prior to this admission) and orthostatic values would recommend HHPT after discharge. She has chronic R shoulder impairments, though she will be living with daughter who can assist with ADLs as needed.     Follow Up Recommendations Home health PT    Equipment Recommendations  Rolling walker with 5" wheels    Recommendations for Other Services       Precautions / Restrictions Precautions Precautions: Fall Restrictions Weight Bearing Restrictions: No      Mobility  Bed Mobility Overal bed mobility: Independent             General bed mobility comments: No deficits noted.   Transfers Overall transfer level: Modified independent Equipment used: Rolling walker (2 wheeled)             General transfer comment: No loss of balance, no complaints of dizziness.   Ambulation/Gait Ambulation/Gait assistance: Modified independent (Device/Increase time) Ambulation Distance (Feet): 200 Feet Assistive device: Rolling walker (2 wheeled) Gait Pattern/deviations: WFL(Within Functional Limits)    Gait velocity interpretation: at or above normal speed for age/gender General Gait Details: Good gait speed, no scissoring or LOB with RW. In brief bout without RW, mild loss of balance to her L side with decreased stride length from when using RW.   Stairs            Wheelchair Mobility    Modified Rankin (Stroke Patients Only)       Balance Overall balance assessment: Modified Independent;No apparent balance deficits (not formally assessed)                                           Pertinent Vitals/Pain Pain Assessment: No/denies pain    Home Living Family/patient expects to be discharged to:: Private residence Living Arrangements: Other relatives Available Help at Discharge: Family;Available PRN/intermittently Type of Home: House Home Access: Stairs to enter   Entrance Stairs-Number of Steps: 2 Home Layout: Two level;Able to live on main level with bedroom/bathroom Home Equipment: Dan HumphreysWalker - 2 wheels;Walker - standard      Prior Function Level of Independence: Independent with assistive device(s)         Comments: Patient alternates between use of std walker and no A/D, though per daughter they have RW at home they will begin to have her use.      Hand Dominance        Extremity/Trunk Assessment   Upper Extremity Assessment: RUE deficits/detail RUE Deficits / Details: Unable to actively flex RUE beyond ~70 degrees due to degenerative changes, chronic.  Lower Extremity Assessment: Overall WFL for tasks assessed         Communication   Communication: No difficulties  Cognition Arousal/Alertness: Awake/alert Behavior During Therapy: WFL for tasks assessed/performed Overall Cognitive Status: Within Functional Limits for tasks assessed                      General Comments      Exercises     Assessment/Plan    PT Assessment Patient needs continued PT services  PT Problem List Decreased strength;Decreased  range of motion;Decreased activity tolerance;Decreased mobility;Decreased knowledge of precautions;Decreased knowledge of use of DME;Cardiopulmonary status limiting activity          PT Treatment Interventions DME instruction;Gait training;Stair training;Balance training;Therapeutic exercise;Therapeutic activities;Patient/family education    PT Goals (Current goals can be found in the Care Plan section)  Acute Rehab PT Goals Patient Stated Goal: To return home safely  PT Goal Formulation: With patient/family Time For Goal Achievement: 03/18/16 Potential to Achieve Goals: Good    Frequency Min 2X/week   Barriers to discharge        Co-evaluation               End of Session Equipment Utilized During Treatment: Gait belt Activity Tolerance: Patient tolerated treatment well Patient left: in bed;with bed alarm set;with call bell/phone within reach Nurse Communication: Mobility status    Functional Assessment Tool Used: Clinical reasoning Functional Limitation: Mobility: Walking and moving around Mobility: Walking and Moving Around Current Status ([id number](G8978): At least 1 percent but less than 20 percent impaired, limited or restricted Mobility: Walking and Moving Around Goal Status [phone](G8979): At least 1 percent but less than 20 percent impaired, limited or restricted    Time: 0917-0938 PT Time Calculation (min) (ACUTE ONLY): 21 min   Charges:   PT Evaluation $PT Eval Moderate Complexity: 1 Procedure     PT G Codes:   PT G-Codes **NOT FOR INPATIENT CLASS** Functional Assessment Tool Used: Clinical reasoning Functional Limitation: Mobility: Walking and moving around Mobility: Walking and Moving Around Current Status ([id number](G8978): At least 1 percent but less than 20 percent impaired, limited or restricted Mobility: Walking and Moving Around Goal Status [phone]([id number]): At least 1 percent but less than 20 percent impaired, limited or restricted   Kerin RansomPatrick A Jayten Gabbard, PT, DPT     03/04/2016, 10:14 AM

## 2016-03-04 NOTE — Progress Notes (Addendum)
Pt A and O x 4. VSS. Pt tolerating diet well. No complaints of pain or nausea. IV removed intact, prescriptions given. Pt voiced understanding of discharge instructions with no further questions. Pt given instruction to check her BP when standing and not to take her BP meds unless her BP was greater than 130. Start back taking BP pills one at a time. Also patient was told to followup in 1 to 2 weeks with primary care. Pt discharged via wheelchair with nurse.

## 2016-03-04 NOTE — Discharge Summary (Signed)
[facility] [place] at [facility]   PATIENT NAME: Briana Cook    MR#:  [id number]  DATE OF BIRTH:  08-Jul-1934  DATE OF ADMISSION:  03/01/2016 ADMITTING PHYSICIAN: Katharina Caperima Zacari Radick, MD  DATE OF DISCHARGE: No discharge date for patient encounter.  PRIMARY CARE PHYSICIAN: No PCP Per Patient     ADMISSION DIAGNOSIS:  Acute renal failure (ARF) (HCC) [N17.9] Acute kidney injury (HCC) [N17.9] Nausea vomiting and diarrhea [R11.2, R19.7]  DISCHARGE DIAGNOSIS:  Principal Problem:   Viral enteritis Active Problems:   Viral gastroenteritis   Hypotension   Acute renal failure (ARF) (HCC)   Acidosis   UTI (urinary tract infection)   SECONDARY DIAGNOSIS:   Past Medical History:  Diagnosis Date  . Hypertension     .pro HOSPITAL COURSE:   The patient is 81 year old Caucasian female with past history significant for history of essential hypertension, vitamin B12 deficiency, gastroesophageal reflux disease, anxiety, depression, hyperlipidemia, who presents to the hospital with complaints of the to 4 day history of nausea, vomiting and diarrhea, weakness, dizziness. On arrival to the hospital, patient's creatinine was noted to be 1.9, she was also hypotensive with systolic blood pressure in 90s. Stool cultures revealed astrovirus. Patient was admitted to the hospital for symptomatic therapy with antiemetics, IV fluids. With conservative therapy. Her condition improved and, diarrhea, nausea, vomiting resolved. On the day of discharge she was able to eat regular diet and had no bowel movements. Patient's urine analysis was found to be abnormal with pyuria, urine cultures showed Klebsiella pneumonia, pansensitive except of ampicillin. Patient was initiated on Keflex, she is to continue antibiotic therapy for 5 days to complete course. She was rehydrated with improvement of her orthostatic vital signs. She was advised to resume her blood pressure medications in 2-3 days after  discharge, watching her clinically. Patient was evaluated by physical therapist and recommended home health services.  Discussion by problem: 1. Acute gastroenteritis-viral in nature. Patient's gastrointestinal PCR is + for astrovirus.  Gastroenteritis, resolved, patient is on regular diet and tolerates it well, no more diarrheal stool .   2. Orthostatic Hypotension - due to dehydration and diarrhea.  - Improved on IV fluids . It is recommended to follow patient's blood pressure readings as outpatient. The patient was advised to drink plenty of fluids.    3. Acute kidney injury-secondary to the nausea vomiting and diarrhea. - Resolved  w/ IV fluids.    3. GERD-continue Protonix.  4. Hyperlipidemia-continue Zocor.  5. Anxiety/depression-continue Zoloft.  6. Hypokalemia-secondary to diarrhea.  - Resolved  7. Urinary tract infection due to Klebsiella pneumonia, patient is initiated on Keflex, to be continued for 5 days to complete course DISCHARGE CONDITIONS:   Stable  CONSULTS OBTAINED:  Treatment Team:  Houston SirenVivek J Sainani, MD  DRUG ALLERGIES:   Allergies  Allergen Reactions  . Oxycodone Other (See Comments)    Pt reports "It makes me crazy"    DISCHARGE MEDICATIONS:   Current Discharge Medication List    START taking these medications   Details  cephALEXin (KEFLEX) 250 MG capsule Take 1 capsule (250 mg total) by mouth 3 (three) times daily. Qty: 15 capsule, Refills: 0      CONTINUE these medications which have NOT CHANGED   Details  aspirin EC 81 MG tablet Take 81 mg by mouth daily.    calcium-vitamin D (OSCAL WITH D) 500-200 MG-UNIT tablet Take 1 tablet by mouth daily with breakfast.    Cyanocobalamin (B-12 PO) Take by mouth.  omeprazole (PRILOSEC) 20 MG capsule Take 1 capsule by mouth daily.    ondansetron (ZOFRAN) 4 MG tablet Take 1 tablet by mouth 3 (three) times daily as needed.    sertraline (ZOLOFT) 100 MG tablet Take 0.5 tablets by mouth 2 (two)  times daily.    simvastatin (ZOCOR) 40 MG tablet Take 1 tablet by mouth every morning.    traMADol (ULTRAM) 50 MG tablet Take 2 tablets by mouth 2 (two) times daily in the am and at bedtime.Marland Kitchen.      STOP taking these medications     lisinopril-hydrochlorothiazide (PRINZIDE,ZESTORETIC) 20-25 MG tablet      metoprolol succinate (TOPROL-XL) 25 MG 24 hr tablet          DISCHARGE INSTRUCTIONS:    The patient is to follow-up with primary care physician as outpatient  If you experience worsening of your admission symptoms, develop shortness of breath, life threatening emergency, suicidal or homicidal thoughts you must seek medical attention immediately by calling 911 or calling your MD immediately  if symptoms less severe.  You Must read complete instructions/literature along with all the possible adverse reactions/side effects for all the Medicines you take and that have been prescribed to you. Take any new Medicines after you have completely understood and accept all the possible adverse reactions/side effects.   Please note  You were cared for by a hospitalist during your hospital stay. If you have any questions about your discharge medications or the care you received while you were in the hospital after you are discharged, you can call the unit and asked to speak with the hospitalist on call if the hospitalist that took care of you is not available. Once you are discharged, your primary care physician will handle any further medical issues. Please note that NO REFILLS for any discharge medications will be authorized once you are discharged, as it is imperative that you return to your primary care physician (or establish a relationship with a primary care physician if you do not have one) for your aftercare needs so that they can reassess your need for medications and monitor your lab values.    Today   CHIEF COMPLAINT:   Chief Complaint  Patient presents with  . Emesis  . Diarrhea     HISTORY OF PRESENT ILLNESS:  Briana ApoMary Climer  is a 81 y.o. female with a known history of essential hypertension, vitamin B12 deficiency, gastroesophageal reflux disease, anxiety, depression, hyperlipidemia, who presents to the hospital with complaints of the to 4 day history of nausea, vomiting and diarrhea, weakness, dizziness. On arrival to the hospital, patient's creatinine was noted to be 1.9, she was also hypotensive with systolic blood pressure in 90s. Stool cultures revealed astrovirus. Patient was admitted to the hospital for symptomatic therapy with antiemetics, IV fluids. With conservative therapy. Her condition improved and, diarrhea, nausea, vomiting resolved. On the day of discharge she was able to eat regular diet and had no bowel movements. Patient's urine analysis was found to be abnormal with pyuria, urine cultures showed Klebsiella pneumonia, pansensitive except of ampicillin. Patient was initiated on Keflex, she is to continue antibiotic therapy for 5 days to complete course. She was rehydrated with improvement of her orthostatic vital signs. She was advised to resume her blood pressure medications in 2-3 days after discharge, watching her clinically. Patient was evaluated by physical therapist and recommended home health services.  Discussion by problem: 1. Acute gastroenteritis-viral in nature. Patient's gastrointestinal PCR is + for astrovirus.  Gastroenteritis, resolved,  patient is on regular diet and tolerates it well, no more diarrheal stool .   2. Orthostatic Hypotension - due to dehydration and diarrhea.  - Improved on IV fluids . It is recommended to follow patient's blood pressure readings as outpatient. The patient was advised to drink plenty of fluids.    3. Acute kidney injury-secondary to the nausea vomiting and diarrhea. - Resolved  w/ IV fluids.    3. GERD-continue Protonix.  4. Hyperlipidemia-continue Zocor.  5. Anxiety/depression-continue Zoloft.  6.  Hypokalemia-secondary to diarrhea.  - Resolved  7. Urinary tract infection due to Klebsiella pneumonia, patient is initiated on Keflex, to be continued for 5 days to complete course    VITAL SIGNS:  Blood pressure (!) 155/68, pulse 68, temperature 98.2 F (36.8 C), temperature source Oral, resp. rate 20, height 5\' 4"  (1.626 m), weight 82.3 kg (181 lb 8 oz), SpO2 93 %.  I/O:   Intake/Output Summary (Last 24 hours) at 03/04/16 1305 Last data filed at 03/04/16 1229  Gross per 24 hour  Intake          2266.58 ml  Output              550 ml  Net          1716.58 ml    PHYSICAL EXAMINATION:  GENERAL:  81 y.o.-year-old patient lying in the bed with no acute distress.  EYES: Pupils equal, round, reactive to light and accommodation. No scleral icterus. Extraocular muscles intact.  HEENT: Head atraumatic, normocephalic. Oropharynx and nasopharynx clear.  NECK:  Supple, no jugular venous distention. No thyroid enlargement, no tenderness.  LUNGS: Normal breath sounds bilaterally, no wheezing, rales,rhonchi or crepitation. No use of accessory muscles of respiration.  CARDIOVASCULAR: S1, S2 normal. No murmurs, rubs, or gallops.  ABDOMEN: Soft, non-tender, non-distended. Bowel sounds present. No organomegaly or mass.  EXTREMITIES: No pedal edema, cyanosis, or clubbing.  NEUROLOGIC: Cranial nerves II through XII are intact. Muscle strength 5/5 in all extremities. Sensation intact. Gait not checked.  PSYCHIATRIC: The patient is alert and oriented x 3.  SKIN: No obvious rash, lesion, or ulcer.   DATA REVIEW:   CBC  Recent Labs Lab 03/02/16 0720  WBC 5.3  HGB 10.8*  HCT 32.3*  PLT 245    Chemistries   Recent Labs Lab 03/01/16 1214 03/01/16 1735  03/04/16 0642  NA 139  --   < > 139  K 4.0  --   < > 3.5  CL 106  --   < > 113*  CO2 20*  --   < > 20*  GLUCOSE 130*  --   < > 97  BUN 39*  --   < > 13  CREATININE 1.95*  --   < > 0.85  CALCIUM 9.2  --   < > 8.1*  MG  --  1.1*  --    --   AST 19  --   --   --   ALT 17  --   --   --   ALKPHOS 44  --   --   --   BILITOT 0.2*  --   --   --   < > = values in this interval not displayed.  Cardiac Enzymes  Recent Labs Lab 03/02/16 0720  TROPONINI <0.03    Microbiology Results  Results for orders placed or performed during the hospital encounter of 03/01/16  C difficile quick scan w PCR reflex     Status: None  Collection Time: 03/01/16  1:46 PM  Result Value Ref Range Status   C Diff antigen NEGATIVE NEGATIVE Final   C Diff toxin NEGATIVE NEGATIVE Final   C Diff interpretation No C. difficile detected.  Final  Gastrointestinal Panel by PCR , Stool     Status: Abnormal   Collection Time: 03/01/16  1:46 PM  Result Value Ref Range Status   Campylobacter species NOT DETECTED NOT DETECTED Final   Plesimonas shigelloides NOT DETECTED NOT DETECTED Final   Salmonella species NOT DETECTED NOT DETECTED Final   Yersinia enterocolitica NOT DETECTED NOT DETECTED Final   Vibrio species NOT DETECTED NOT DETECTED Final   Vibrio cholerae NOT DETECTED NOT DETECTED Final   Enteroaggregative E coli (EAEC) NOT DETECTED NOT DETECTED Final   Enteropathogenic E coli (EPEC) NOT DETECTED NOT DETECTED Final   Enterotoxigenic E coli (ETEC) NOT DETECTED NOT DETECTED Final   Shiga like toxin producing E coli (STEC) NOT DETECTED NOT DETECTED Final   Shigella/Enteroinvasive E coli (EIEC) NOT DETECTED NOT DETECTED Final   Cryptosporidium NOT DETECTED NOT DETECTED Final   Cyclospora cayetanensis NOT DETECTED NOT DETECTED Final   Entamoeba histolytica NOT DETECTED NOT DETECTED Final   Giardia lamblia NOT DETECTED NOT DETECTED Final   Adenovirus F40/41 NOT DETECTED NOT DETECTED Final   Astrovirus DETECTED (A) NOT DETECTED Final   Norovirus GI/GII NOT DETECTED NOT DETECTED Final   Rotavirus A NOT DETECTED NOT DETECTED Final   Sapovirus (I, II, IV, and V) NOT DETECTED NOT DETECTED Final  Urine culture     Status: Abnormal   Collection Time:  03/01/16  4:23 PM  Result Value Ref Range Status   Specimen Description URINE, RANDOM  Final   Special Requests NONE  Final   Culture >=100,000 COLONIES/mL KLEBSIELLA PNEUMONIAE (A)  Final   Report Status 03/04/2016 FINAL  Final   Organism ID, Bacteria KLEBSIELLA PNEUMONIAE (A)  Final      Susceptibility   Klebsiella pneumoniae - MIC*    AMPICILLIN >=32 RESISTANT Resistant     CEFAZOLIN <=4 SENSITIVE Sensitive     CEFTRIAXONE <=1 SENSITIVE Sensitive     CIPROFLOXACIN <=0.25 SENSITIVE Sensitive     GENTAMICIN <=1 SENSITIVE Sensitive     IMIPENEM <=0.25 SENSITIVE Sensitive     NITROFURANTOIN 32 SENSITIVE Sensitive     TRIMETH/SULFA <=20 SENSITIVE Sensitive     AMPICILLIN/SULBACTAM 4 SENSITIVE Sensitive     PIP/TAZO <=4 SENSITIVE Sensitive     Extended ESBL NEGATIVE Sensitive     * >=100,000 COLONIES/mL KLEBSIELLA PNEUMONIAE    RADIOLOGY:  No results found.  EKG:   Orders placed or performed during the hospital encounter of 03/01/16  . ED EKG  . ED EKG      Management plans discussed with the patient, family and they are in agreement.  CODE STATUS:     Code Status Orders        Start     Ordered   03/01/16 1854  Full code  Continuous     03/01/16 1853    Code Status History    Date Active Date Inactive Code Status Order ID Comments User Context   This patient has a current code status but no historical code status.    Advance Directive Documentation   Flowsheet Row Most Recent Value  Type of Advance Directive  Healthcare Power of Attorney [Machael Bugbee]  Pre-existing out of facility DNR order (yellow form or pink MOST form)  No data  "MOST" Form in  Place?  No data      TOTAL TIME TAKING CARE OF THIS PATIENT: 40 minutes.    Katharina Caper M.D on 03/04/2016 at 1:05 PM  Between 7am to 6pm - Pager - [phone]  After 6pm go to [email or web address] - password [email or web address] [facility]  [facility]  Office  [phone]  CC: Primary care physician; No PCP  Per Patient

## 2016-03-04 NOTE — Care Management Note (Signed)
Case Management Note  Patient Details  Name: Jenny ReichmannMary Joann Logan MRN: [id number] Date of Birth: [birth year]  Subjective/Objective:       Discussed discharge planning with Mrs Daleen BoBeaver's daughter Selena BattenKim Campbell/ Daughter is taking Mrs [place] home with her today for an indefinite period of time. Mrs Christain SacramentoBeaver has orders for HH-PT, OT, RN. Daughter is agreeable with this plan and wants all communication with the home health agency to her phone [phone]. Address that Mrs Christain SacramentoBeaver will be going to is her daughters address in [place] = [address] [place], [place], [place], [id number]. [facility].           Action/Plan:   Expected Discharge Date:                  Expected Discharge Plan:     In-House Referral:     Discharge planning Services     Post Acute Care Choice:    Choice offered to:     DME Arranged:    DME Agency:     HH Arranged:    HH Agency:     Status of Service:     If discussed at [employer] Length of Stay Meetings, dates discussed:    Additional Comments:  Nygeria Lager A, RN 03/04/2016, 10:03 AM

## 2018-02-24 ENCOUNTER — Emergency Department
Admission: EM | Admit: 2018-02-24 | Discharge: 2018-02-25 | Disposition: A | Discharge status: Home / Self Care | Payer: Medicare Other | Attending: Emergency Medicine | Admitting: Emergency Medicine

## 2018-02-24 ENCOUNTER — Other Ambulatory Visit: Payer: Self-pay

## 2018-02-24 DIAGNOSIS — R197 Diarrhea, unspecified: Secondary | ICD-10-CM | POA: Diagnosis present

## 2018-02-24 DIAGNOSIS — Z79899 Other long term (current) drug therapy: Secondary | ICD-10-CM | POA: Insufficient documentation

## 2018-02-24 DIAGNOSIS — N390 Urinary tract infection, site not specified: Secondary | ICD-10-CM | POA: Insufficient documentation

## 2018-02-24 DIAGNOSIS — I10 Essential (primary) hypertension: Secondary | ICD-10-CM | POA: Insufficient documentation

## 2018-02-24 DIAGNOSIS — Z96611 Presence of right artificial shoulder joint: Secondary | ICD-10-CM | POA: Diagnosis not present

## 2018-02-24 DIAGNOSIS — Z7982 Long term (current) use of aspirin: Secondary | ICD-10-CM | POA: Insufficient documentation

## 2018-02-24 DIAGNOSIS — Z96651 Presence of right artificial knee joint: Secondary | ICD-10-CM | POA: Insufficient documentation

## 2018-02-24 LAB — URINALYSIS, COMPLETE (UACMP) WITH MICROSCOPIC
BILIRUBIN URINE: NEGATIVE
GLUCOSE, UA: NEGATIVE mg/dL
HGB URINE DIPSTICK: NEGATIVE
NITRITE: POSITIVE — AB
PROTEIN: NEGATIVE mg/dL
Specific Gravity, Urine: 1.01 (ref 1.005–1.030)
pH: 5.5 (ref 5.0–8.0)

## 2018-02-24 LAB — COMPREHENSIVE METABOLIC PANEL
ALBUMIN: 3.9 g/dL (ref 3.5–5.0)
ALT: 18 U/L (ref 0–44)
ANION GAP: 10 (ref 5–15)
AST: 22 U/L (ref 15–41)
Alkaline Phosphatase: 42 U/L (ref 38–126)
BUN: 20 mg/dL (ref 8–23)
CALCIUM: 9.1 mg/dL (ref 8.9–10.3)
CHLORIDE: 102 mmol/L (ref 98–111)
CO2: 26 mmol/L (ref 22–32)
Creatinine, Ser: 1.04 mg/dL — ABNORMAL HIGH (ref 0.44–1.00)
GFR calc Af Amer: 57 mL/min — ABNORMAL LOW (ref >60)
GFR calc non Af Amer: 49 mL/min — ABNORMAL LOW (ref >60)
GLUCOSE: 105 mg/dL — AB (ref 70–99)
POTASSIUM: 3.9 mmol/L (ref 3.5–5.1)
SODIUM: 138 mmol/L (ref 135–145)
TOTAL PROTEIN: 6.7 g/dL (ref 6.5–8.1)
Total Bilirubin: 0.9 mg/dL (ref 0.3–1.2)

## 2018-02-24 LAB — CBC
HCT: 40.8 % (ref 36.0–46.0)
HEMOGLOBIN: 13.3 g/dL (ref 12.0–15.0)
MCH: 30.6 pg (ref 26.0–34.0)
MCHC: 32.6 g/dL (ref 30.0–36.0)
MCV: 94 fL (ref 80.0–100.0)
NRBC: 0 % (ref 0.0–0.2)
PLATELETS: 268 10*3/uL (ref 150–400)
RBC: 4.34 MIL/uL (ref 3.87–5.11)
RDW: 13.9 % (ref 11.5–15.5)
WBC: 7.8 10*3/uL (ref 4.0–10.5)

## 2018-02-24 LAB — LIPASE, BLOOD: LIPASE: 31 U/L (ref 11–51)

## 2018-02-24 MED ORDER — CEPHALEXIN 500 MG PO CAPS: 500.0000 mg | ORAL_CAPSULE | Freq: Three times a day (TID) | ORAL | 0 refills | Status: AC

## 2018-02-24 MED ORDER — CEPHALEXIN 500 MG PO CAPS
500.0000 mg | ORAL_CAPSULE | Freq: Once | ORAL | Status: AC
  Administered 2018-02-24: 500 mg via ORAL
  Filled 2018-02-24: qty 1

## 2018-02-24 MED ORDER — SODIUM CHLORIDE 0.9 % IV BOLUS
1000.0000 mL | Freq: Once | INTRAVENOUS | Status: AC
  Administered 2018-02-24: 1000 mL via INTRAVENOUS

## 2018-02-24 NOTE — ED Provider Notes (Signed)
[facility] Emergency Department Provider Note  ___________________________________________   First MD Initiated Contact with Patient 02/24/18 1929     (approximate)  I have reviewed the triage vital signs and the nursing notes.   HISTORY  Chief Complaint Diarrhea   HPI Briana Cook is a 83 y.o. female with a history of hypertension and UTI who was presented to the emergency department today complaining of diarrhea over the past 4 days.  Patient says that she last had a stool that was watery and loose at 5 PM today.  Denies any blood in her stool.  Denies any vomiting.  Says that she is cramping around the time of stooling and that eating food provokes the diarrhea.  Family is concerned because she is having decreased p.o. intake and is shown signs of slight confusion.  Also concerned because the patient was admitted for similar symptoms several years ago when she was diagnosed with acute renal failure.  Family also states that the patient has had several UTIs with similar presentations.  Patient denies any recent hospitalizations or antibiotics.  Reports 2 episodes of diarrhea today.   Past Medical History:  Diagnosis Date  . Hypertension     Patient Active Problem List   Diagnosis Date Noted  . Viral enteritis 03/01/2016  . Hypotension 03/01/2016  . Acute renal failure (ARF) (HCC) 03/01/2016  . Acidosis 03/01/2016  . UTI (urinary tract infection) 03/01/2016  . Viral gastroenteritis 03/01/2016    Past Surgical History:  Procedure Laterality Date  . REPLACEMENT TOTAL KNEE Right   . TOTAL SHOULDER REPLACEMENT Right     Prior to Admission medications   Medication Sig Start Date End Date Taking? Authorizing Provider  aspirin EC 81 MG tablet Take 81 mg by mouth daily.    [provider]  calcium-vitamin D (OSCAL WITH D) 500-200 MG-UNIT tablet Take 1 tablet by mouth daily with breakfast.    [provider]  cephALEXin (KEFLEX)  250 MG capsule Take 1 capsule (250 mg total) by mouth 3 (three) times daily. 03/04/16   Katharina Caper, MD  Cyanocobalamin (B-12 PO) Take by mouth.    [provider]  omeprazole (PRILOSEC) 20 MG capsule Take 1 capsule by mouth daily. 02/20/16   [provider]  ondansetron (ZOFRAN) 4 MG tablet Take 1 tablet by mouth 3 (three) times daily as needed. 01/19/16   [provider]  sertraline (ZOLOFT) 100 MG tablet Take 0.5 tablets by mouth 2 (two) times daily. 02/19/16   [provider]  simvastatin (ZOCOR) 40 MG tablet Take 1 tablet by mouth every morning. 02/20/16   [provider]  traMADol (ULTRAM) 50 MG tablet Take 2 tablets by mouth 2 (two) times daily in the am and at bedtime.. 01/26/16   [provider]    Allergies Oxycodone  History reviewed. No pertinent family history.  Social History Social History   Tobacco Use  . Smoking status: Never Smoker  . Smokeless tobacco: Never Used  Substance Use Topics  . Alcohol use: Yes    Comment: occasionally  . Drug use: No    Review of Systems  Constitutional: No fever/chills Eyes: No visual changes. ENT: No sore throat. Cardiovascular: Denies chest pain. Respiratory: Denies shortness of breath. Gastrointestinal:  No constipation. Genitourinary: Negative for dysuria. Musculoskeletal: Negative for back pain. Skin: Negative for rash. Neurological: Negative for headaches, focal weakness or numbness.   ____________________________________________   PHYSICAL EXAM:  VITAL SIGNS: ED Triage Vitals  Enc  Vitals Group     BP 02/24/18 1728 134/68     Pulse Rate 02/24/18 1728 72     Resp 02/24/18 1728 16     Temp 02/24/18 1728 97.8 F (36.6 C)     Temp Source 02/24/18 1728 Oral     SpO2 02/24/18 1728 96 %     Weight 02/24/18 1728 190 lb (86.2 kg)     Height 02/24/18 1728 5\' 4"  (1.626 m)     Head Circumference --      Peak Flow --      Pain Score 02/24/18 1731 4     Pain Loc  --      Pain Edu? --      Excl. in GC? --     Constitutional: Alert and oriented. Well appearing and in no acute distress. Eyes: Conjunctivae are normal.  Head: Atraumatic. Nose: No congestion/rhinnorhea. Mouth/Throat: Mucous membranes are moist.  Neck: No stridor.   Cardiovascular: Normal rate, regular rhythm. Grossly normal heart sounds.  Good peripheral circulation. Respiratory: Normal respiratory effort.  No retractions. Lungs CTAB. Gastrointestinal: Soft and nontender. No distention. No CVA tenderness. Musculoskeletal: No lower extremity tenderness nor edema.  No joint effusions. Neurologic:  Normal speech and language. No gross focal neurologic deficits are appreciated. Skin:  Skin is warm, dry and intact. No rash noted. Psychiatric: Mood and affect are normal. Speech and behavior are normal.  ____________________________________________   LABS (all labs ordered are listed, but only abnormal results are displayed)  Labs Reviewed  COMPREHENSIVE METABOLIC PANEL - Abnormal; Notable for the following components:      Result Value   Glucose, Bld 105 (*)    Creatinine, Ser 1.04 (*)    GFR calc non Af Amer 49 (*)    GFR calc Af Amer 57 (*)    All other components within normal limits  URINALYSIS, COMPLETE (UACMP) WITH MICROSCOPIC - Abnormal; Notable for the following components:   Ketones, ur TRACE (*)    Nitrite POSITIVE (*)    Leukocytes, UA TRACE (*)    Bacteria, UA MANY (*)    All other components within normal limits  C DIFFICILE QUICK SCREEN W PCR REFLEX  GASTROINTESTINAL PANEL BY PCR, STOOL (REPLACES STOOL CULTURE)  URINE CULTURE  LIPASE, BLOOD  CBC   ____________________________________________  EKG   ____________________________________________  RADIOLOGY   ____________________________________________   PROCEDURES  Procedure(s) performed:   Procedures  Critical Care performed:   ____________________________________________   INITIAL  IMPRESSION / ASSESSMENT AND PLAN / ED COURSE  Pertinent labs & imaging results that were available during my care of the patient were reviewed by me and considered in my medical decision making (see chart for details).  DDX: Viral diarrhea, C. difficile, infectious diarrhea, colitis, UTI, dehydration, renal failure, electrolyte abnormality As part of my medical decision making, I reviewed the following data within the electronic MEDICAL RECORD NUMBER Notes from prior ED visits  ----------------------------------------- 11:43 PM on 02/24/2018 -----------------------------------------  Patient with urine positive for UTI.  Receiving fluids at this time.  Given first dose of Keflex.  Pending stool studies.  However if negative I believe the patient may be safely discharged home.  Family aware of diagnosis as well as plan.  Signed out to Dr. York Cerise.  Also reviewed previous cultures and sensitive to family cephalosporins. ____________________________________________   FINAL CLINICAL IMPRESSION(S) / ED DIAGNOSES  UTI.  Diarrhea.  NEW MEDICATIONS STARTED DURING THIS VISIT:  New Prescriptions   No medications on file  Note:  This document was prepared using Dragon voice recognition software and may include unintentional dictation errors.     Myrna Blazer, MD 02/24/18 [phone]

## 2018-02-24 NOTE — ED Provider Notes (Signed)
-----------------------------------------   11:20 PM on 02/24/2018 -----------------------------------------   Assuming care from Dr. Pershing Proud.  In short, Briana Cook is a 83 y.o. female with a chief complaint of diarrhea.  Refer to the original H&P for additional details.  The current plan of care is to treat as a UTI, but follow up stool studies and reassess.    ----------------------------------------- 1:07 AM on 02/25/2018 -----------------------------------------  Patient is happy and in no acute distress.  Stool studies were all negative including C. difficile.  Patient and her son are comfortable with the plan for discharge and outpatient follow-up.  I gave my usual and customary return precautions.    Loleta Rose, MD 02/25/18 [phone]

## 2018-02-24 NOTE — ED Triage Notes (Signed)
Diarrhea since Thursday. States watery. Denies blood in it. States nausea. A&Ox4. In wheelchair. Denies fever. C/o abd pain as well.

## 2018-02-25 LAB — GASTROINTESTINAL PANEL BY PCR, STOOL (REPLACES STOOL CULTURE)

## 2018-02-25 LAB — C DIFFICILE QUICK SCREEN W PCR REFLEX
C Diff antigen: NEGATIVE
C Diff interpretation: NOT DETECTED
C Diff toxin: NEGATIVE

## 2018-02-27 LAB — URINE CULTURE: Culture: >=100000 — AB

## 2018-04-06 ENCOUNTER — Emergency Department: Payer: Medicare Other

## 2018-04-06 ENCOUNTER — Inpatient Hospital Stay
Admission: EM | Admit: 2018-04-06 | Discharge: 2018-04-08 | DRG: 871 | Disposition: A | Discharge status: Home / Self Care | Payer: Medicare Other | Attending: Internal Medicine | Admitting: Internal Medicine

## 2018-04-06 DIAGNOSIS — I10 Essential (primary) hypertension: Secondary | ICD-10-CM | POA: Diagnosis present

## 2018-04-06 DIAGNOSIS — T380X5A Adverse effect of glucocorticoids and synthetic analogues, initial encounter: Secondary | ICD-10-CM | POA: Diagnosis present

## 2018-04-06 DIAGNOSIS — E785 Hyperlipidemia, unspecified: Secondary | ICD-10-CM | POA: Diagnosis present

## 2018-04-06 DIAGNOSIS — A419 Sepsis, unspecified organism: Principal | ICD-10-CM | POA: Diagnosis present

## 2018-04-06 DIAGNOSIS — Z885 Allergy status to narcotic agent status: Secondary | ICD-10-CM | POA: Diagnosis not present

## 2018-04-06 DIAGNOSIS — E119 Type 2 diabetes mellitus without complications: Secondary | ICD-10-CM | POA: Diagnosis present

## 2018-04-06 DIAGNOSIS — Z8249 Family history of ischemic heart disease and other diseases of the circulatory system: Secondary | ICD-10-CM | POA: Diagnosis not present

## 2018-04-06 DIAGNOSIS — J9601 Acute respiratory failure with hypoxia: Secondary | ICD-10-CM | POA: Diagnosis present

## 2018-04-06 DIAGNOSIS — Z79891 Long term (current) use of opiate analgesic: Secondary | ICD-10-CM | POA: Diagnosis not present

## 2018-04-06 DIAGNOSIS — K219 Gastro-esophageal reflux disease without esophagitis: Secondary | ICD-10-CM | POA: Diagnosis present

## 2018-04-06 DIAGNOSIS — J189 Pneumonia, unspecified organism: Secondary | ICD-10-CM | POA: Diagnosis present

## 2018-04-06 DIAGNOSIS — Z96611 Presence of right artificial shoulder joint: Secondary | ICD-10-CM | POA: Diagnosis present

## 2018-04-06 DIAGNOSIS — Z79899 Other long term (current) drug therapy: Secondary | ICD-10-CM | POA: Diagnosis not present

## 2018-04-06 DIAGNOSIS — Z96651 Presence of right artificial knee joint: Secondary | ICD-10-CM | POA: Diagnosis present

## 2018-04-06 HISTORY — DX: Hyperlipidemia, unspecified: E78.5

## 2018-04-06 HISTORY — DX: Type 2 diabetes mellitus without complications: E11.9

## 2018-04-06 HISTORY — DX: Gastro-esophageal reflux disease without esophagitis: K21.9

## 2018-04-06 LAB — CBC WITH DIFFERENTIAL/PLATELET
Abs Immature Granulocytes: 0.43 10*3/uL — ABNORMAL HIGH (ref 0.00–0.07)
BASOS ABS: 0 10*3/uL (ref 0.0–0.1)
Basophils Relative: 0 %
EOS ABS: 0.1 10*3/uL (ref 0.0–0.5)
Eosinophils Relative: 1 %
HCT: 33.6 % — ABNORMAL LOW (ref 36.0–46.0)
Hemoglobin: 11 g/dL — ABNORMAL LOW (ref 12.0–15.0)
Immature Granulocytes: 2 %
LYMPHS ABS: 0.8 10*3/uL (ref 0.7–4.0)
Lymphocytes Relative: 4 %
MCH: 30.7 pg (ref 26.0–34.0)
MCHC: 32.7 g/dL (ref 30.0–36.0)
MCV: 93.9 fL (ref 80.0–100.0)
Monocytes Absolute: 1.2 10*3/uL — ABNORMAL HIGH (ref 0.1–1.0)
Monocytes Relative: 6 %
NRBC: 0 % (ref 0.0–0.2)
Neutro Abs: 16.9 10*3/uL — ABNORMAL HIGH (ref 1.7–7.7)
Neutrophils Relative %: 87 %
PLATELETS: 325 10*3/uL (ref 150–400)
RBC: 3.58 MIL/uL — AB (ref 3.87–5.11)
RDW: 13.4 % (ref 11.5–15.5)
WBC: 19.4 10*3/uL — ABNORMAL HIGH (ref 4.0–10.5)

## 2018-04-06 LAB — COMPREHENSIVE METABOLIC PANEL
ALT: 26 U/L (ref 0–44)
ANION GAP: 10 (ref 5–15)
AST: 26 U/L (ref 15–41)
Albumin: 2.9 g/dL — ABNORMAL LOW (ref 3.5–5.0)
Alkaline Phosphatase: 67 U/L (ref 38–126)
BUN: 23 mg/dL (ref 8–23)
CHLORIDE: 101 mmol/L (ref 98–111)
CO2: 24 mmol/L (ref 22–32)
Calcium: 7.9 mg/dL — ABNORMAL LOW (ref 8.9–10.3)
Creatinine, Ser: 0.88 mg/dL (ref 0.44–1.00)
Glucose, Bld: 119 mg/dL — ABNORMAL HIGH (ref 70–99)
POTASSIUM: 3.5 mmol/L (ref 3.5–5.1)
SODIUM: 135 mmol/L (ref 135–145)
Total Bilirubin: 1.3 mg/dL — ABNORMAL HIGH (ref 0.3–1.2)
Total Protein: 6.5 g/dL (ref 6.5–8.1)

## 2018-04-06 LAB — URINALYSIS, COMPLETE (UACMP) WITH MICROSCOPIC
BACTERIA UA: NONE SEEN
BILIRUBIN URINE: NEGATIVE
GLUCOSE, UA: NEGATIVE mg/dL
HGB URINE DIPSTICK: NEGATIVE
Ketones, ur: NEGATIVE mg/dL
NITRITE: NEGATIVE
PH: 6 (ref 5.0–8.0)
Protein, ur: NEGATIVE mg/dL
SPECIFIC GRAVITY, URINE: 1.011 (ref 1.005–1.030)

## 2018-04-06 LAB — LACTIC ACID, PLASMA: LACTIC ACID, VENOUS: 1 mmol/L (ref 0.5–1.9)

## 2018-04-06 LAB — TROPONIN I: Troponin I: 0.03 ng/mL (ref <0.03)

## 2018-04-06 LAB — INFLUENZA PANEL BY PCR (TYPE A & B)
Influenza A By PCR: NEGATIVE
Influenza B By PCR: NEGATIVE

## 2018-04-06 MED ORDER — IPRATROPIUM-ALBUTEROL 0.5-2.5 (3) MG/3ML IN SOLN
3.0000 mL | Freq: Once | RESPIRATORY_TRACT | Status: AC
  Administered 2018-04-06: 3 mL via RESPIRATORY_TRACT
  Filled 2018-04-06: qty 3

## 2018-04-06 MED ORDER — LIDOCAINE HCL (PF) 1 % IJ SOLN
5.0000 mL | Freq: Once | INTRAMUSCULAR | Status: AC
  Administered 2018-04-06: 5 mL via INTRADERMAL

## 2018-04-06 MED ORDER — SODIUM CHLORIDE 0.9 % IV SOLN
2.0000 g | INTRAVENOUS | Status: DC
  Administered 2018-04-06 – 2018-04-07 (×2): 2 g via INTRAVENOUS
  Filled 2018-04-06 (×2): qty 20
  Filled 2018-04-06: qty 2

## 2018-04-06 MED ORDER — LIDOCAINE HCL (PF) 1 % IJ SOLN
INTRAMUSCULAR | Status: AC
  Administered 2018-04-06: 5 mL via INTRADERMAL
  Filled 2018-04-06: qty 5

## 2018-04-06 MED ORDER — SODIUM CHLORIDE 0.9 % IV SOLN
500.0000 mg | INTRAVENOUS | Status: DC
  Administered 2018-04-06 – 2018-04-07 (×2): 500 mg via INTRAVENOUS
  Filled 2018-04-06 (×3): qty 500

## 2018-04-06 NOTE — ED Provider Notes (Signed)
[facility] Emergency Department Provider Note    First MD Initiated Contact with Patient 04/06/18 2045     (approximate)  I have reviewed the triage vital signs and the nursing notes.   HISTORY  Chief Complaint SOB   HPI Briana Cook is a 83 y.o. female since the ER for evaluation of several days of productive cough and shortness of breath with generalized weakness.  Family is visiting patient and called EMS due to profound weakness and her inability to ambulate.  She denies any pain.  No vomiting.  No diarrhea.  She denies any recent antibiotics.  No recent hospitalizations reported.  EMS found the patient in with standing the patient was hypoxic to 75%.  She does not wear home oxygen.    Past Medical History:  Diagnosis Date  . Diabetes (HCC)   . GERD (gastroesophageal reflux disease)   . HLD (hyperlipidemia)   . Hypertension    Family History  Problem Relation Age of Onset  . Hypertension Mother   . Cancer Father   . Cancer Sister   . Cancer Brother    Past Surgical History:  Procedure Laterality Date  . REPLACEMENT TOTAL KNEE Right   . TOTAL SHOULDER REPLACEMENT Right    Patient Active Problem List   Diagnosis Date Noted  . CAP (community acquired pneumonia) 04/06/2018  . HTN (hypertension) 04/06/2018  . Diabetes (HCC) 04/06/2018  . HLD (hyperlipidemia) 04/06/2018  . GERD (gastroesophageal reflux disease) 04/06/2018  . Viral enteritis 03/01/2016  . Hypotension 03/01/2016  . Acute renal failure (ARF) (HCC) 03/01/2016  . Acidosis 03/01/2016  . UTI (urinary tract infection) 03/01/2016  . Viral gastroenteritis 03/01/2016      Prior to Admission medications   Medication Sig Start Date End Date Taking? Authorizing Provider  acetaminophen (TYLENOL) 500 MG tablet Take 1,000 mg by mouth every 6 (six) hours as needed for mild pain.   Yes [provider]  amLODipine (NORVASC) 5 MG tablet Take 5 mg by mouth daily. 03/10/18  Yes  [provider]  aspirin EC 81 MG tablet Take 81 mg by mouth daily.   Yes [provider]  Calcium Carbonate-Vitamin D 600-400 MG-UNIT tablet Take 2 tablets by mouth daily.    Yes [provider]  DULoxetine (CYMBALTA) 60 MG capsule Take 60 mg by mouth daily. 03/10/18  Yes [provider]  metoprolol succinate (TOPROL-XL) 25 MG 24 hr tablet Take 25 mg by mouth 2 (two) times daily.  03/10/18  Yes [provider]  omeprazole (PRILOSEC) 20 MG capsule Take 1 capsule by mouth daily. 02/20/16  Yes [provider]  simvastatin (ZOCOR) 40 MG tablet Take 1 tablet by mouth every morning. 02/20/16  Yes [provider]  traMADol (ULTRAM) 50 MG tablet Take 100 mg by mouth every 8 (eight) hours as needed for moderate pain.  01/26/16  Yes [provider]  vitamin B-12 (CYANOCOBALAMIN) 1000 MCG tablet Take 1,000 mcg by mouth daily.   Yes [provider]    Allergies Oxycodone    Social History Social History   Tobacco Use  . Smoking status: Never Smoker  . Smokeless tobacco: Never Used  Substance Use Topics  . Alcohol use: Yes    Comment: occasionally  . Drug use: No    Review of Systems Patient denies headaches, rhinorrhea, blurry vision, numbness, shortness of breath, chest pain, edema, cough, abdominal pain, nausea, vomiting, diarrhea, dysuria, fevers, rashes or hallucinations unless otherwise stated above in  HPI. ____________________________________________   PHYSICAL EXAM:  VITAL SIGNS: Vitals:   04/06/18 2053 04/06/18 2100  BP:  (!) 130/57  Pulse:  82  Resp:  (!) 23  Temp: 99.5 F (37.5 C)   SpO2:  93%    Constitutional: Alert and oriented. Pleasant but inmild respiratory distress Eyes: Conjunctivae are normal.  Head: Atraumatic. Nose: No congestion/rhinnorhea. Mouth/Throat: Mucous membranes are moist.   Neck: No stridor. Painless ROM.  Cardiovascular: Normal rate, regular rhythm. Grossly normal  heart sounds.  Good peripheral circulation. Respiratory: mild tachypnea, with left anterior wheeze, no crackles Gastrointestinal: Soft and nontender. No distention. No abdominal bruits. No CVA tenderness. Genitourinary:  Musculoskeletal: No lower extremity tenderness nor edema.  No joint effusions. Neurologic:  Normal speech and language. No gross focal neurologic deficits are appreciated. No facial droop Skin:  Skin is warm, dry and intact. No rash noted. Psychiatric: Mood and affect are normal. Speech and behavior are normal.  ____________________________________________   LABS (all labs ordered are listed, but only abnormal results are displayed)  Results for orders placed or performed during the hospital encounter of 04/06/18 (from the past 24 hour(s))  CBC with Differential/Platelet     Status: Abnormal   Collection Time: 04/06/18  8:50 PM  Result Value Ref Range   WBC 19.4 (H) 4.0 - 10.5 K/uL   RBC 3.58 (L) 3.87 - 5.11 MIL/uL   Hemoglobin 11.0 (L) 12.0 - 15.0 g/dL   HCT 69.6 (L) 29.5 - 28.4 %   MCV 93.9 80.0 - 100.0 fL   MCH 30.7 26.0 - 34.0 pg   MCHC 32.7 30.0 - 36.0 g/dL   RDW 13.2 44.0 - 10.2 %   Platelets 325 150 - 400 K/uL   nRBC 0.0 0.0 - 0.2 %   Neutrophils Relative % 87 %   Neutro Abs 16.9 (H) 1.7 - 7.7 K/uL   Lymphocytes Relative 4 %   Lymphs Abs 0.8 0.7 - 4.0 K/uL   Monocytes Relative 6 %   Monocytes Absolute 1.2 (H) 0.1 - 1.0 K/uL   Eosinophils Relative 1 %   Eosinophils Absolute 0.1 0.0 - 0.5 K/uL   Basophils Relative 0 %   Basophils Absolute 0.0 0.0 - 0.1 K/uL   Immature Granulocytes 2 %   Abs Immature Granulocytes 0.43 (H) 0.00 - 0.07 K/uL   Tear Drop Cells PRESENT    Polychromasia PRESENT   Comprehensive metabolic panel     Status: Abnormal   Collection Time: 04/06/18  8:50 PM  Result Value Ref Range   Sodium 135 135 - 145 mmol/L   Potassium 3.5 3.5 - 5.1 mmol/L   Chloride 101 98 - 111 mmol/L   CO2 24 22 - 32 mmol/L   Glucose, Bld 119 (H) 70 - 99  mg/dL   BUN 23 8 - 23 mg/dL   Creatinine, Ser 7.25 0.44 - 1.00 mg/dL   Calcium 7.9 (L) 8.9 - 10.3 mg/dL   Total Protein 6.5 6.5 - 8.1 g/dL   Albumin 2.9 (L) 3.5 - 5.0 g/dL   AST 26 15 - 41 U/L   ALT 26 0 - 44 U/L   Alkaline Phosphatase 67 38 - 126 U/L   Total Bilirubin 1.3 (H) 0.3 - 1.2 mg/dL   GFR calc non Af Amer >60 >60 mL/min   GFR calc Af Amer >60 >60 mL/min   Anion gap 10 5 - 15  Troponin I - ONCE - STAT     Status: Abnormal   Collection Time: 04/06/18  8:50 PM  Result Value Ref Range   Troponin I 0.03 (HH) <0.03 ng/mL  Lactic acid, plasma     Status: None   Collection Time: 04/06/18  9:01 PM  Result Value Ref Range   Lactic Acid, Venous 1.0 0.5 - 1.9 mmol/L  Influenza panel by PCR (type A & B)     Status: None   Collection Time: 04/06/18  9:01 PM  Result Value Ref Range   Influenza A By PCR NEGATIVE NEGATIVE   Influenza B By PCR NEGATIVE NEGATIVE   ____________________________________________   ____________________________________________  RADIOLOGY  I personally reviewed all radiographic images ordered to evaluate for the above acute complaints and reviewed radiology reports and findings.  These findings were personally discussed with the patient.  Please see medical record for radiology report.  ____________________________________________   PROCEDURES  Procedure(s) performed:  Procedures  Due to difficulty with obtaining IV access, a 20G peripheral IV catheter was inserted using US guidance into the RUE.  The site was prepped with chlorhexidine and allowed to dry.  The patient tolerated the procedure without any complications.    Critical Care performed: yes ____________________________________________   INITIAL IMPRESSION / ASSESSMENT AND PLAN / ED COURSE  Pertinent labs & imaging results that were available during my care of the patient were reviewed by me and considered in my medical decision making (see chart for details).   DDX: pna, copd, chf,  viral illness, dehydration  Briana Cook is a 83 y.o. who presents to the ED with chief complaint as described above.  Patient afebrile here but reportedly febrile to 102 this evening.  Found to have significant hypoxia with moderate respiratory distress.  Improved with supplemental oxygen.  Exam is consistent with pneumonia.  Chest x-ray shows evidence of multilobar pneumonia.  Lactate is normal.  Otherwise hemodynamically stable.  Patient given antibiotics to treat for community-acquired pneumonia.  Given nebulizer treatment.  Based on her age, hypoxia multilobar pneumonia patient will require hospitalization for IV antibiotics and further titration of oxygen.      As part of my medical decision making, I reviewed the following data within the electronic MEDICAL RECORD NUMBER Nursing notes reviewed and incorporated, Labs reviewed, notes from prior ED visits.  ____________________________________________   FINAL CLINICAL IMPRESSION(S) / ED DIAGNOSES  Final diagnoses:  Pneumonia of right lung due to infectious organism, unspecified part of lung  Acute respiratory failure with hypoxia (HCC)      NEW MEDICATIONS STARTED DURING THIS VISIT:  New Prescriptions   No medications on file     Note:  This document was prepared using Dragon voice recognition software and may include unintentional dictation errors.    Willy Eddy, MD 04/06/18 [phone]

## 2018-04-06 NOTE — ED Triage Notes (Signed)
Pt arrived from home via EMS with complaints of weakness and dizziness. Pt has a non-productive cough with congestion. EMS stated hearing rhonchi. No wheezing. Pt was 88%RA per EMS so they placed her on 3L nasal cannula. Pt not normally on oxygen at home. VS per EMS HR-93 R-22 Pt is orthostatic first BP(supine)-140/90 BP(standing)-75/33. CBG-171 temp-101.3. NSR on monitor. Pt had about NaCl en route. EMS placed a 18 gauge in Left  AC. Pt's medication and health Hx at bedside. Pt alert and oriented x 4. Denies any pain at this time

## 2018-04-06 NOTE — ED Notes (Signed)
Pt VERY HARD OF HEARING

## 2018-04-06 NOTE — ED Notes (Signed)
Date and time results received: 04/06/18 2230 (use smartphrase ".now" to insert current time)  Test: troponin Critical Value: 0.03  Name of Provider Notified: Dr. Roxan Hockeyobinson  Orders Received? Or Actions Taken?:

## 2018-04-06 NOTE — H&P (Signed)
[facility] - [place] at [facility]   PATIENT NAME: Briana Cook    MR#:  [id number]  DATE OF BIRTH:  06-May-1935  DATE OF ADMISSION:  04/06/2018  PRIMARY CARE PHYSICIAN: System, Pcp Not In   REQUESTING/REFERRING PHYSICIAN: Roxan Hockey, MD  CHIEF COMPLAINT:  Cough  HISTORY OF PRESENT ILLNESS:  Briana Cook  is a 83 y.o. female who presents with chief complaint as above.  Presents with cough with some sputum production.  Patient states this is been getting worse for the past couple of days.  Here in the ED she was found to have pneumonia.  Hospitalist were called for admission  PAST MEDICAL HISTORY:   Past Medical History:  Diagnosis Date  . Diabetes (HCC)   . GERD (gastroesophageal reflux disease)   . HLD (hyperlipidemia)   . Hypertension      PAST SURGICAL HISTORY:   Past Surgical History:  Procedure Laterality Date  . REPLACEMENT TOTAL KNEE Right   . TOTAL SHOULDER REPLACEMENT Right      SOCIAL HISTORY:   Social History   Tobacco Use  . Smoking status: Never Smoker  . Smokeless tobacco: Never Used  Substance Use Topics  . Alcohol use: Yes    Comment: occasionally     FAMILY HISTORY:   Family History  Problem Relation Age of Onset  . Hypertension Mother   . Cancer Father   . Cancer Sister   . Cancer Brother      DRUG ALLERGIES:   Allergies  Allergen Reactions  . Oxycodone Other (See Comments)    Pt reports "It makes me crazy"    MEDICATIONS AT HOME:   Prior to Admission medications   Medication Sig Start Date End Date Taking? Authorizing Provider  acetaminophen (TYLENOL) 500 MG tablet Take 1,000 mg by mouth every 6 (six) hours as needed for mild pain.   Yes [provider]  amLODipine (NORVASC) 5 MG tablet Take 5 mg by mouth daily. 03/10/18  Yes [provider]  aspirin EC 81 MG tablet Take 81 mg by mouth daily.   Yes [provider]  Calcium Carbonate-Vitamin D 600-400 MG-UNIT tablet Take 2  tablets by mouth daily.    Yes [provider]  DULoxetine (CYMBALTA) 60 MG capsule Take 60 mg by mouth daily. 03/10/18  Yes [provider]  metoprolol succinate (TOPROL-XL) 25 MG 24 hr tablet Take 25 mg by mouth 2 (two) times daily.  03/10/18  Yes [provider]  omeprazole (PRILOSEC) 20 MG capsule Take 1 capsule by mouth daily. 02/20/16  Yes [provider]  simvastatin (ZOCOR) 40 MG tablet Take 1 tablet by mouth every morning. 02/20/16  Yes [provider]  traMADol (ULTRAM) 50 MG tablet Take 100 mg by mouth every 8 (eight) hours as needed for moderate pain.  01/26/16  Yes [provider]  vitamin B-12 (CYANOCOBALAMIN) 1000 MCG tablet Take 1,000 mcg by mouth daily.   Yes [provider]    REVIEW OF SYSTEMS:  Review of Systems  Constitutional: Negative for chills, fever, malaise/fatigue and weight loss.  HENT: Negative for ear pain, hearing loss and tinnitus.   Eyes: Negative for blurred vision, double vision, pain and redness.  Respiratory: Positive for cough, sputum production and shortness of breath. Negative for hemoptysis.   Cardiovascular: Negative for chest pain, palpitations, orthopnea and leg swelling.  Gastrointestinal: Negative for abdominal pain, constipation, diarrhea, nausea and vomiting.  Genitourinary: Negative for dysuria, frequency and hematuria.  Musculoskeletal: Negative  for back pain, joint pain and neck pain.  Skin:       No acne, rash, or lesions  Neurological: Negative for dizziness, tremors, focal weakness and weakness.  Endo/Heme/Allergies: Negative for polydipsia. Does not bruise/bleed easily.  Psychiatric/Behavioral: Negative for depression. The patient is not nervous/anxious and does not have insomnia.      VITAL SIGNS:   Vitals:   04/06/18 2050 04/06/18 2053 04/06/18 2100  BP: (!) 122/49  (!) 130/57  Pulse: 86  82  Resp: (!) 30  (!) 23  Temp:  99.5 F (37.5 C)   TempSrc:  Oral   SpO2:  94%  93%  Weight:  81.6 kg   Height:  5\' 3"  (1.6 m)    Wt Readings from Last 3 Encounters:  04/06/18 81.6 kg  02/24/18 86.2 kg  03/03/16 82.3 kg    PHYSICAL EXAMINATION:  Physical Exam  Vitals reviewed. Constitutional: She is oriented to person, place, and time. She appears well-developed and well-nourished. No distress.  HENT:  Head: Normocephalic and atraumatic.  Mouth/Throat: Oropharynx is clear and moist.  Eyes: Pupils are equal, round, and reactive to light. Conjunctivae and EOM are normal. No scleral icterus.  Neck: Normal range of motion. Neck supple. No JVD present. No thyromegaly present.  Cardiovascular: Normal rate, regular rhythm and intact distal pulses. Exam reveals no gallop and no friction rub.  No murmur heard. Respiratory: Effort normal. No respiratory distress. She has no wheezes. She has no rales.  Bilateral coarse breath sounds  GI: Soft. Bowel sounds are normal. She exhibits no distension. There is no tenderness.  Musculoskeletal: Normal range of motion. She exhibits no edema.  No arthritis, no gout  Lymphadenopathy:    She has no cervical adenopathy.  Neurological: She is alert and oriented to person, place, and time. No cranial nerve deficit.  No dysarthria, no aphasia  Skin: Skin is warm and dry. No rash noted. No erythema.  Psychiatric: She has a normal mood and affect. Her behavior is normal. Judgment and thought content normal.    LABORATORY PANEL:   CBC Recent Labs  Lab 04/06/18 2050  WBC 19.4*  HGB 11.0*  HCT 33.6*  PLT 325   ------------------------------------------------------------------------------------------------------------------  Chemistries  Recent Labs  Lab 04/06/18 2050  NA 135  K 3.5  CL 101  CO2 24  GLUCOSE 119*  BUN 23  CREATININE 0.88  CALCIUM 7.9*  AST 26  ALT 26  ALKPHOS 67  BILITOT 1.3*    ------------------------------------------------------------------------------------------------------------------  Cardiac Enzymes Recent Labs  Lab 04/06/18 2050  TROPONINI 0.03*   ------------------------------------------------------------------------------------------------------------------  RADIOLOGY:  Dg Chest 2 View  Result Date: 04/06/2018 CLINICAL DATA:  Weakness and dizziness as well as nonproductive cough and chest congestion. Low grade fever. EXAM: CHEST - 2 VIEW COMPARISON:  None. FINDINGS: Hazy airspace consolidation is noted in the right upper lobe and in the right lower lobe. Left lung is clear. No pleural effusion or pneumothorax. Mild enlargement of the cardiopericardial silhouette. No mediastinal or hilar masses. No convincing adenopathy. Skeletal structures are demineralized. Right shoulder prosthesis appears well seated. There are advanced arthropathic changes of the left shoulder. IMPRESSION: 1. Multifocal pneumonia involving the right upper and lower lobes. Electronically Signed   By: Amie Portlandavid  Ormond M.D.   On: 04/06/2018 22:09    EKG:   Orders placed or performed during the hospital encounter of 04/06/18  . ED EKG  . ED EKG    IMPRESSION AND PLAN:  Principal Problem:   CAP (community  acquired pneumonia) -IV antibiotics, PRN duo nebs and antitussive, supplemental oxygen PRN Active Problems:   HTN (hypertension) -continue home dose antihypertensives   Diabetes (HCC) -carb modified diet with sliding scale insulin coverage   HLD (hyperlipidemia) -continue home dose antilipid   GERD (gastroesophageal reflux disease) -continue home dose PPI  Chart review performed and case discussed with ED provider. Labs, imaging and/or ECG reviewed by provider and discussed with patient/family. Management plans discussed with the patient and/or family.  DVT PROPHYLAXIS: SubQ lovenox   GI PROPHYLAXIS:  PPI   ADMISSION STATUS: Inpatient     CODE STATUS: Full Code  Status History    Date Active Date Inactive Code Status Order ID Comments User Context   03/01/2016 1853 03/04/2016 1714 Full Code [id number]  Katharina Caper, MD Inpatient      TOTAL TIME TAKING CARE OF THIS PATIENT: 45 minutes.   Bardia Wangerin FIELDING 04/06/2018, 11:25 PM  [facility]  Office  [phone]  CC: Primary care physician; System, Pcp Not In  Note:  This document was prepared using Dragon voice recognition software and may include unintentional dictation errors.

## 2018-04-07 ENCOUNTER — Other Ambulatory Visit: Payer: Self-pay

## 2018-04-07 LAB — GLUCOSE, CAPILLARY
GLUCOSE-CAPILLARY: 101 mg/dL — AB (ref 70–99)
Glucose-Capillary: 96 mg/dL (ref 70–99)
Glucose-Capillary: 152 mg/dL — ABNORMAL HIGH (ref 70–99)
Glucose-Capillary: 169 mg/dL — ABNORMAL HIGH (ref 70–99)

## 2018-04-07 LAB — CBC
HEMATOCRIT: 31.1 % — AB (ref 36.0–46.0)
Hemoglobin: 10.2 g/dL — ABNORMAL LOW (ref 12.0–15.0)
MCH: 30.7 pg (ref 26.0–34.0)
MCHC: 32.8 g/dL (ref 30.0–36.0)
MCV: 93.7 fL (ref 80.0–100.0)
Platelets: 329 10*3/uL (ref 150–400)
RBC: 3.32 MIL/uL — ABNORMAL LOW (ref 3.87–5.11)
RDW: 13.6 % (ref 11.5–15.5)
WBC: 17 10*3/uL — ABNORMAL HIGH (ref 4.0–10.5)
nRBC: 0 % (ref 0.0–0.2)

## 2018-04-07 LAB — BASIC METABOLIC PANEL
Anion gap: 12 (ref 5–15)
BUN: 20 mg/dL (ref 8–23)
CHLORIDE: 102 mmol/L (ref 98–111)
CO2: 23 mmol/L (ref 22–32)
Calcium: 8.1 mg/dL — ABNORMAL LOW (ref 8.9–10.3)
Creatinine, Ser: 0.81 mg/dL (ref 0.44–1.00)
GFR calc Af Amer: >60 mL/min (ref >60)
GFR calc non Af Amer: >60 mL/min (ref >60)
Glucose, Bld: 111 mg/dL — ABNORMAL HIGH (ref 70–99)
Potassium: 3.5 mmol/L (ref 3.5–5.1)
Sodium: 137 mmol/L (ref 135–145)

## 2018-04-07 LAB — STREP PNEUMONIAE URINARY ANTIGEN: Strep Pneumo Urinary Antigen: NEGATIVE

## 2018-04-07 LAB — TROPONIN I: Troponin I: 0.03 ng/mL (ref <0.03)

## 2018-04-07 MED ORDER — INSULIN ASPART 100 UNIT/ML ~~LOC~~ SOLN
0.0000 [IU] | Freq: Three times a day (TID) | SUBCUTANEOUS | Status: DC
  Administered 2018-04-07 – 2018-04-08 (×2): 2 [IU] via SUBCUTANEOUS
  Filled 2018-04-07 (×2): qty 1

## 2018-04-07 MED ORDER — ACETAMINOPHEN 325 MG PO TABS
650.0000 mg | ORAL_TABLET | Freq: Four times a day (QID) | ORAL | Status: DC | PRN
  Administered 2018-04-07 – 2018-04-08 (×4): 650 mg via ORAL
  Filled 2018-04-07 (×4): qty 2

## 2018-04-07 MED ORDER — ONDANSETRON HCL 4 MG PO TABS: 4.0000 mg | ORAL_TABLET | Freq: Four times a day (QID) | ORAL | Status: DC | PRN

## 2018-04-07 MED ORDER — ACETAMINOPHEN 650 MG RE SUPP: 650.0000 mg | Freq: Four times a day (QID) | RECTAL | Status: DC | PRN

## 2018-04-07 MED ORDER — GUAIFENESIN-DM 100-10 MG/5ML PO SYRP
5.0000 mL | ORAL_SOLUTION | ORAL | Status: DC | PRN
  Administered 2018-04-07 (×2): 5 mL via ORAL
  Filled 2018-04-07 (×2): qty 5

## 2018-04-07 MED ORDER — PANTOPRAZOLE SODIUM 40 MG PO TBEC
40.0000 mg | DELAYED_RELEASE_TABLET | Freq: Every day | ORAL | Status: DC
  Administered 2018-04-07 – 2018-04-08 (×2): 40 mg via ORAL
  Filled 2018-04-07 (×2): qty 1

## 2018-04-07 MED ORDER — TRAMADOL HCL 50 MG PO TABS
50.0000 mg | ORAL_TABLET | Freq: Four times a day (QID) | ORAL | Status: DC | PRN
  Administered 2018-04-07 – 2018-04-08 (×4): 50 mg via ORAL
  Filled 2018-04-07 (×4): qty 1

## 2018-04-07 MED ORDER — SODIUM CHLORIDE 0.9 % IV SOLN
INTRAVENOUS | Status: DC | PRN
  Administered 2018-04-07: 250 mL via INTRAVENOUS

## 2018-04-07 MED ORDER — IPRATROPIUM-ALBUTEROL 0.5-2.5 (3) MG/3ML IN SOLN
3.0000 mL | Freq: Three times a day (TID) | RESPIRATORY_TRACT | Status: DC
  Administered 2018-04-07: 3 mL via RESPIRATORY_TRACT
  Filled 2018-04-07: qty 3

## 2018-04-07 MED ORDER — PREDNISONE 50 MG PO TABS
50.0000 mg | ORAL_TABLET | Freq: Every day | ORAL | Status: DC
  Administered 2018-04-07 – 2018-04-08 (×2): 50 mg via ORAL
  Filled 2018-04-07 (×2): qty 1

## 2018-04-07 MED ORDER — ATORVASTATIN CALCIUM 20 MG PO TABS
20.0000 mg | ORAL_TABLET | Freq: Every day | ORAL | Status: DC
  Administered 2018-04-07 – 2018-04-08 (×2): 20 mg via ORAL
  Filled 2018-04-07 (×2): qty 1

## 2018-04-07 MED ORDER — AMLODIPINE BESYLATE 5 MG PO TABS
5.0000 mg | ORAL_TABLET | Freq: Every day | ORAL | Status: DC
  Administered 2018-04-07 – 2018-04-08 (×2): 5 mg via ORAL
  Filled 2018-04-07 (×2): qty 1

## 2018-04-07 MED ORDER — ONDANSETRON HCL 4 MG/2ML IJ SOLN: 4.0000 mg | Freq: Four times a day (QID) | INTRAMUSCULAR | Status: DC | PRN

## 2018-04-07 MED ORDER — ENOXAPARIN SODIUM 40 MG/0.4ML ~~LOC~~ SOLN
40.0000 mg | SUBCUTANEOUS | Status: DC
  Administered 2018-04-07: 40 mg via SUBCUTANEOUS
  Filled 2018-04-07: qty 0.4

## 2018-04-07 MED ORDER — SIMVASTATIN 40 MG PO TABS
40.0000 mg | ORAL_TABLET | ORAL | Status: DC
  Filled 2018-04-07 (×2): qty 1

## 2018-04-07 MED ORDER — DULOXETINE HCL 30 MG PO CPEP
60.0000 mg | ORAL_CAPSULE | Freq: Every day | ORAL | Status: DC
  Administered 2018-04-07 – 2018-04-08 (×2): 60 mg via ORAL
  Filled 2018-04-07 (×2): qty 2

## 2018-04-07 MED ORDER — IPRATROPIUM-ALBUTEROL 0.5-2.5 (3) MG/3ML IN SOLN
3.0000 mL | Freq: Three times a day (TID) | RESPIRATORY_TRACT | Status: DC
  Administered 2018-04-07 – 2018-04-08 (×3): 3 mL via RESPIRATORY_TRACT
  Filled 2018-04-07 (×3): qty 3

## 2018-04-07 MED ORDER — IPRATROPIUM-ALBUTEROL 0.5-2.5 (3) MG/3ML IN SOLN: 3.0000 mL | RESPIRATORY_TRACT | Status: DC | PRN

## 2018-04-07 MED ORDER — BENZONATATE 100 MG PO CAPS
100.0000 mg | ORAL_CAPSULE | Freq: Three times a day (TID) | ORAL | Status: DC | PRN
  Administered 2018-04-07 (×2): 100 mg via ORAL
  Filled 2018-04-07 (×2): qty 1

## 2018-04-07 MED ORDER — ASPIRIN EC 81 MG PO TBEC
81.0000 mg | DELAYED_RELEASE_TABLET | Freq: Every day | ORAL | Status: DC
  Administered 2018-04-07 – 2018-04-08 (×2): 81 mg via ORAL
  Filled 2018-04-07 (×2): qty 1

## 2018-04-07 NOTE — Progress Notes (Signed)
Patient reported that she is not diabetic- that her PCP had said she was prediabetic and questioned whether she needed the Accuchecks and sliding scale insulin. She would prefer not to have them done if possible.

## 2018-04-07 NOTE — ED Notes (Signed)
ED TO INPATIENT HANDOFF REPORT  Name/Age/Gender Briana Cook 83 y.o. female  Code Status Code Status History    Date Active Date Inactive Code Status Order ID Comments User Context   03/01/2016 1853 03/04/2016 1714 Full Code [id number]  Katharina Caper, MD Inpatient      Home/SNF/Other home  Chief Complaint weakness  Level of Care/Admitting Diagnosis ED Disposition    ED Disposition Condition Comment   Admit  Hospital Area: [facility] [[id number]]  Level of Care: Med-Surg [16]  Diagnosis: CAP (community acquired pneumonia) [409811]  Admitting Physician: Oralia Manis [[id number]]  Attending Physician: Oralia Manis [phone]  Estimated length of stay: past midnight tomorrow  Certification:: I certify this patient will need inpatient services for at least 2 midnights  PT Class (Do Not Modify): Inpatient [101]  PT Acc Code (Do Not Modify): Private [1]       Medical History Past Medical History:  Diagnosis Date  . Diabetes (HCC)   . GERD (gastroesophageal reflux disease)   . HLD (hyperlipidemia)   . Hypertension     Allergies Allergies  Allergen Reactions  . Oxycodone Other (See Comments)    Pt reports "It makes me crazy"    IV Location/Drains/Wounds Patient Lines/Drains/Airways Status   Active Line/Drains/Airways    Name:   Placement date:   Placement time:   Site:   Days:   Peripheral IV 04/06/18 Right Arm   04/06/18    2143    Arm   1          Labs/Imaging Results for orders placed or performed during the hospital encounter of 04/06/18 (from the past 48 hour(s))  CBC with Differential/Platelet     Status: Abnormal   Collection Time: 04/06/18  8:50 PM  Result Value Ref Range   WBC 19.4 (H) 4.0 - 10.5 K/uL   RBC 3.58 (L) 3.87 - 5.11 MIL/uL   Hemoglobin 11.0 (L) 12.0 - 15.0 g/dL   HCT 65.7 (L) 84.6 - 96.2 %   MCV 93.9 80.0 - 100.0 fL   MCH 30.7 26.0 - 34.0 pg   MCHC 32.7 30.0 - 36.0 g/dL   RDW 95.2 84.1 - 32.4 %   Platelets 325  150 - 400 K/uL   nRBC 0.0 0.0 - 0.2 %   Neutrophils Relative % 87 %   Neutro Abs 16.9 (H) 1.7 - 7.7 K/uL   Lymphocytes Relative 4 %   Lymphs Abs 0.8 0.7 - 4.0 K/uL   Monocytes Relative 6 %   Monocytes Absolute 1.2 (H) 0.1 - 1.0 K/uL   Eosinophils Relative 1 %   Eosinophils Absolute 0.1 0.0 - 0.5 K/uL   Basophils Relative 0 %   Basophils Absolute 0.0 0.0 - 0.1 K/uL   Immature Granulocytes 2 %   Abs Immature Granulocytes 0.43 (H) 0.00 - 0.07 K/uL   Tear Drop Cells PRESENT    Polychromasia PRESENT     Comment: Performed at [facility], [address] [place], [place] [address]  Comprehensive metabolic panel     Status: Abnormal   Collection Time: 04/06/18  8:50 PM  Result Value Ref Range   Sodium 135 135 - 145 mmol/L   Potassium 3.5 3.5 - 5.1 mmol/L   Chloride 101 98 - 111 mmol/L   CO2 24 22 - 32 mmol/L   Glucose, Bld 119 (H) 70 - 99 mg/dL   BUN 23 8 - 23 mg/dL   Creatinine, Ser 7.25 0.44 - 1.00 mg/dL   Calcium  7.9 (L) 8.9 - 10.3 mg/dL   Total Protein 6.5 6.5 - 8.1 g/dL   Albumin 2.9 (L) 3.5 - 5.0 g/dL   AST 26 15 - 41 U/L   ALT 26 0 - 44 U/L   Alkaline Phosphatase 67 38 - 126 U/L   Total Bilirubin 1.3 (H) 0.3 - 1.2 mg/dL   GFR calc non Af Amer >60 >60 mL/min   GFR calc Af Amer >60 >60 mL/min   Anion gap 10 5 - 15    Comment: Performed at [facility], [address] [place], [place] [address]  Troponin I - ONCE - STAT     Status: Abnormal   Collection Time: 04/06/18  8:50 PM  Result Value Ref Range   Troponin I 0.03 (HH) <0.03 ng/mL    Comment: CRITICAL RESULT CALLED TO, READ BACK BY AND VERIFIED WITH KAYLA Jerlyn Pain ON 04/06/18 AT 2229 JAG Performed at [facility], [address] [place], [place] [address]   Urinalysis, Complete w Microscopic     Status: Abnormal   Collection Time: 04/06/18  9:01 PM  Result Value Ref Range   Color, Urine YELLOW (A) YELLOW   APPearance HAZY (A) CLEAR   Specific Gravity, Urine 1.011 1.005 - 1.030   pH 6.0  5.0 - 8.0   Glucose, UA NEGATIVE NEGATIVE mg/dL   Hgb urine dipstick NEGATIVE NEGATIVE   Bilirubin Urine NEGATIVE NEGATIVE   Ketones, ur NEGATIVE NEGATIVE mg/dL   Protein, ur NEGATIVE NEGATIVE mg/dL   Nitrite NEGATIVE NEGATIVE   Leukocytes, UA TRACE (A) NEGATIVE   RBC / HPF 0-5 0 - 5 RBC/hpf   WBC, UA 0-5 0 - 5 WBC/hpf   Bacteria, UA NONE SEEN NONE SEEN   Squamous Epithelial / LPF 6-10 0 - 5    Comment: Performed at [facility], [address] [place], [place] [address]  Lactic acid, plasma     Status: None   Collection Time: 04/06/18  9:01 PM  Result Value Ref Range   Lactic Acid, Venous 1.0 0.5 - 1.9 mmol/L    Comment: Performed at [facility], [address] [place], [place] [address]  Influenza panel by PCR (type A & B)     Status: None   Collection Time: 04/06/18  9:01 PM  Result Value Ref Range   Influenza A By PCR NEGATIVE NEGATIVE   Influenza B By PCR NEGATIVE NEGATIVE    Comment: (NOTE) The Xpert Xpress Flu assay is intended as an aid in the diagnosis of  influenza and should not be used as a sole basis for treatment.  This  assay is FDA approved for nasopharyngeal swab specimens only. Nasal  washings and aspirates are unacceptable for Xpert Xpress Flu testing. Performed at [facility], [address] [place], [place] [address]    Dg Chest 2 View  Result Date: 04/06/2018 CLINICAL DATA:  Weakness and dizziness as well as nonproductive cough and chest congestion. Low grade fever. EXAM: CHEST - 2 VIEW COMPARISON:  None. FINDINGS: Hazy airspace consolidation is noted in the right upper lobe and in the right lower lobe. Left lung is clear. No pleural effusion or pneumothorax. Mild enlargement of the cardiopericardial silhouette. No mediastinal or hilar masses. No convincing adenopathy. Skeletal structures are demineralized. Right shoulder prosthesis appears well seated. There are advanced arthropathic changes of the left shoulder. IMPRESSION:  1. Multifocal pneumonia involving the right upper and lower lobes. Electronically Signed   By: Renard Hamper.D.  On: 04/06/2018 22:09    Pending Labs Unresulted Labs (From admission, onward)    Start     Ordered   04/06/18 2054  Lactic acid, plasma  STAT Now then every 3 hours,   STAT     04/06/18 2054   04/06/18 2054  Blood Culture (routine x 2)  BLOOD CULTURE X 2,   STAT     04/06/18 2054   04/06/18 2054  Urine culture  ONCE - STAT,   STAT     04/06/18 2054   Signed and Held  CBC  (enoxaparin (LOVENOX)    CrCl >/= 30 ml/min)  Once,   R    Comments:  Baseline for enoxaparin therapy IF NOT ALREADY DRAWN.  Notify MD if PLT < 100 K.    Signed and Held   Signed and Held  Creatinine, serum  (enoxaparin (LOVENOX)    CrCl >/= 30 ml/min)  Once,   R    Comments:  Baseline for enoxaparin therapy IF NOT ALREADY DRAWN.    Signed and Held   Signed and Held  Creatinine, serum  (enoxaparin (LOVENOX)    CrCl >/= 30 ml/min)  Weekly,   R    Comments:  while on enoxaparin therapy    Signed and Held   Signed and Held  Basic metabolic panel  Tomorrow morning,   R     Signed and Held   Signed and Held  CBC  Tomorrow morning,   R     Signed and Held   Signed and Held  Troponin I - Now Then Q6H  Now then every 6 hours,   R     Signed and Held          Vitals/Pain Today's Vitals   04/06/18 2053 04/06/18 2100 04/06/18 2330 04/07/18 0000  BP:  (!) 130/57 123/67 (!) 126/45  Pulse:  82 90 85  Resp:  (!) 23 (!) 21 (!) 21  Temp: 99.5 F (37.5 C)     TempSrc: Oral     SpO2:  93% 95% 93%  Weight: 81.6 kg     Height: 5\' 3"  (1.6 m)     PainSc:        Isolation Precautions Droplet precaution  Medications Medications  cefTRIAXone (ROCEPHIN) 2 g in sodium chloride 0.9 % 100 mL IVPB (0 g Intravenous Stopped 04/06/18 2319)  azithromycin (ZITHROMAX) 500 mg in sodium chloride 0.9 % 250 mL IVPB (0 mg Intravenous Stopped 04/07/18 0026)  ipratropium-albuterol (DUONEB) 0.5-2.5 (3) MG/3ML nebulizer  solution 3 mL (3 mLs Nebulization Given 04/06/18 2115)  ipratropium-albuterol (DUONEB) 0.5-2.5 (3) MG/3ML nebulizer solution 3 mL (3 mLs Nebulization Given 04/06/18 2115)  lidocaine (PF) (XYLOCAINE) 1 % injection 5 mL (5 mLs Intradermal Given 04/06/18 2145)    Mobility Walks

## 2018-04-07 NOTE — Progress Notes (Signed)
[facility]   PATIENT NAME: Briana Cook    MR#:  [id number]  DATE OF BIRTH:  01/10/1935  SUBJECTIVE:  CHIEF COMPLAINT:  No chief complaint on file.  Shortness of breath still present.  Cough.  Wheezing. Afebrile Needing 2 L oxygen to keep saturations over 88%  REVIEW OF SYSTEMS:    Review of Systems  Constitutional: Positive for malaise/fatigue. Negative for chills and fever.  HENT: Negative for sore throat.   Eyes: Negative for blurred vision, double vision and pain.  Respiratory: Positive for cough, shortness of breath and wheezing. Negative for hemoptysis.   Cardiovascular: Negative for chest pain, palpitations, orthopnea and leg swelling.  Gastrointestinal: Negative for abdominal pain, constipation, diarrhea, heartburn, nausea and vomiting.  Genitourinary: Negative for dysuria and hematuria.  Musculoskeletal: Negative for back pain and joint pain.  Skin: Negative for rash.  Neurological: Negative for sensory change, speech change, focal weakness and headaches.  Endo/Heme/Allergies: Does not bruise/bleed easily.  Psychiatric/Behavioral: Negative for depression. The patient is not nervous/anxious.     DRUG ALLERGIES:   Allergies  Allergen Reactions  . Oxycodone Other (See Comments)    Pt reports "It makes me crazy"    VITALS:  Blood pressure 130/80, pulse 87, temperature 98.4 F (36.9 C), temperature source Oral, resp. rate 20, height 5\' 4"  (1.626 m), weight 79.6 kg, SpO2 93 %.  PHYSICAL EXAMINATION:   Physical Exam  GENERAL:  83 y.o.-year-old patient lying in the bed with no acute distress.  EYES: Pupils equal, round, reactive to light and accommodation. No scleral icterus. Extraocular muscles intact.  HEENT: Head atraumatic, normocephalic. Oropharynx and nasopharynx clear.  NECK:  Supple, no jugular venous distention. No thyroid enlargement, no tenderness.  LUNGS: Bilateral wheezing CARDIOVASCULAR: S1, S2 normal. No  murmurs, rubs, or gallops.  ABDOMEN: Soft, nontender, nondistended. Bowel sounds present. No organomegaly or mass.  EXTREMITIES: No cyanosis, clubbing or edema b/l.    NEUROLOGIC: Cranial nerves II through XII are intact. No focal Motor or sensory deficits b/l.   PSYCHIATRIC: The patient is alert and oriented x 3.  SKIN: No obvious rash, lesion, or ulcer.   LABORATORY PANEL:   CBC Recent Labs  Lab 04/07/18 0631  WBC 17.0*  HGB 10.2*  HCT 31.1*  PLT 329   ------------------------------------------------------------------------------------------------------------------ Chemistries  Recent Labs  Lab 04/06/18 2050 04/07/18 0631  NA 135 137  K 3.5 3.5  CL 101 102  CO2 24 23  GLUCOSE 119* 111*  BUN 23 20  CREATININE 0.88 0.81  CALCIUM 7.9* 8.1*  AST 26  --   ALT 26  --   ALKPHOS 67  --   BILITOT 1.3*  --    ------------------------------------------------------------------------------------------------------------------  Cardiac Enzymes Recent Labs  Lab 04/07/18 0631  TROPONINI 0.03*   ------------------------------------------------------------------------------------------------------------------  RADIOLOGY:  Dg Chest 2 View  Result Date: 04/06/2018 CLINICAL DATA:  Weakness and dizziness as well as nonproductive cough and chest congestion. Low grade fever. EXAM: CHEST - 2 VIEW COMPARISON:  None. FINDINGS: Hazy airspace consolidation is noted in the right upper lobe and in the right lower lobe. Left lung is clear. No pleural effusion or pneumothorax. Mild enlargement of the cardiopericardial silhouette. No mediastinal or hilar masses. No convincing adenopathy. Skeletal structures are demineralized. Right shoulder prosthesis appears well seated. There are advanced arthropathic changes of the left shoulder. IMPRESSION: 1. Multifocal pneumonia involving the right upper and lower lobes. Electronically Signed   By: Amie Portland M.D.   On: 04/06/2018  22:09     ASSESSMENT  AND PLAN:    * Right lung community acquired pneumonia with sepsis and acute hypoxic respiratory failure present on admission On IV antibiotics. Will add prednisone. Sputum culture was added. Wean oxygen as tolerated Nebulizers   * HTN (hypertension) -continue home dose antihypertensives    * Diabetes  .  Blood sugars likely be higher with prednisone.  Patient was regular diet and ordered.    * HLD (hyperlipidemia) -continue home dose antilipid  *  GERD  -continue home dose PPI  All the records are reviewed and case discussed with Care Management/Social Worker Management plans discussed with the patient, family and they are in agreement.  CODE STATUS: FULL CODE  DVT Prophylaxis: SCDs  TOTAL TIME TAKING CARE OF THIS PATIENT: 35 minutes.   POSSIBLE D/C IN 1-2 DAYS, DEPENDING ON CLINICAL CONDITION.  Molinda BailiffSrikar R Jerritt Cardoza M.D on 04/07/2018 at 12:11 PM  Between 7am to 6pm - Pager - [phone]  After 6pm go to [email or web address] - password [email or web address]  [facility] [place] [facility]  Office  [phone]  CC: Primary care physician; System, Pcp Not In  Note: This dictation was prepared with Dragon dictation along with smaller phrase technology. Any transcriptional errors that result from this process are unintentional.

## 2018-04-07 NOTE — Progress Notes (Signed)
PHARMACIST - PHYSICIAN ORDER COMMUNICATION  CONCERNING: P&T Policy on Simvastatin 40 mg daily and amlodipine - rhabdomyolysis risk   DESCRIPTION:  Patients on amlodipine and simvastatin >20mg /day have reported cases of rhabdomyolysis. Pharmacy is to assess simvastatin dose. If > 20 mg, substitute atorvastatin (Lipitor) 1mg  for each 2mg  simvastatin.   ACTION TAKEN: Patient's order for simvastatin 40mg  QD has been changed to Atorvastatin 20mg   QD per protocol.   Gardner CandleSheema M Emili Mcloughlin, PharmD, BCPS Clinical Pharmacist 04/07/2018 7:36 AM

## 2018-04-08 LAB — CBC WITH DIFFERENTIAL/PLATELET
Abs Immature Granulocytes: 0.61 10*3/uL — ABNORMAL HIGH (ref 0.00–0.07)
Basophils Absolute: 0 10*3/uL (ref 0.0–0.1)
Basophils Relative: 0 %
Eosinophils Absolute: 0.1 10*3/uL (ref 0.0–0.5)
Eosinophils Relative: 1 %
HEMATOCRIT: 31.1 % — AB (ref 36.0–46.0)
Hemoglobin: 10.1 g/dL — ABNORMAL LOW (ref 12.0–15.0)
Immature Granulocytes: 5 %
LYMPHS ABS: 0.6 10*3/uL — AB (ref 0.7–4.0)
Lymphocytes Relative: 5 %
MCH: 30.8 pg (ref 26.0–34.0)
MCHC: 32.5 g/dL (ref 30.0–36.0)
MCV: 94.8 fL (ref 80.0–100.0)
Monocytes Absolute: 0.8 10*3/uL (ref 0.1–1.0)
Monocytes Relative: 6 %
NEUTROS PCT: 83 %
Neutro Abs: 10.6 10*3/uL — ABNORMAL HIGH (ref 1.7–7.7)
Platelets: 340 10*3/uL (ref 150–400)
RBC: 3.28 MIL/uL — ABNORMAL LOW (ref 3.87–5.11)
RDW: 13.5 % (ref 11.5–15.5)
WBC: 12.8 10*3/uL — ABNORMAL HIGH (ref 4.0–10.5)
nRBC: 0 % (ref 0.0–0.2)

## 2018-04-08 LAB — GLUCOSE, CAPILLARY
Glucose-Capillary: 113 mg/dL — ABNORMAL HIGH (ref 70–99)
Glucose-Capillary: 188 mg/dL — ABNORMAL HIGH (ref 70–99)

## 2018-04-08 MED ORDER — ALBUTEROL SULFATE HFA 108 (90 BASE) MCG/ACT IN AERS: 2.0000 | INHALATION_SPRAY | Freq: Four times a day (QID) | RESPIRATORY_TRACT | 0 refills | Status: DC | PRN

## 2018-04-08 MED ORDER — LEVOFLOXACIN 500 MG PO TABS: 500.0000 mg | ORAL_TABLET | Freq: Every day | ORAL | 0 refills | Status: DC

## 2018-04-08 MED ORDER — PREDNISONE 10 MG (21) PO TBPK: ORAL_TABLET | ORAL | 0 refills | Status: DC

## 2018-04-08 MED ORDER — GUAIFENESIN-DM 100-10 MG/5ML PO SYRP: 5.0000 mL | ORAL_SOLUTION | ORAL | 0 refills | Status: DC | PRN

## 2018-04-08 NOTE — Discharge Instructions (Signed)
Resume diet and activity as before ° ° °

## 2018-04-08 NOTE — Care Management (Signed)
Patient for discharge home today.  Discussed during progression of need to have home 02 assessment preformed prior to discharge as patient's current oxygen requirement is acute.  Informed that on ambulation on room air, stas 90%.  CM requested that the complete home 02 assessment be documented. No discharge needs identified by members of the care team

## 2018-04-08 NOTE — Progress Notes (Signed)
SATURATION QUALIFICATIONS: (This note is used to comply with regulatory documentation for home oxygen)  Patient Saturations on Room Air at Rest = 92%  Patient Saturations on Room Air while Ambulating = 90%   

## 2018-04-09 LAB — URINE CULTURE: Culture: 10000 — AB

## 2018-04-11 LAB — CULTURE, BLOOD (ROUTINE X 2)
Culture: NO GROWTH
Special Requests: ADEQUATE

## 2018-04-12 LAB — CULTURE, BLOOD (ROUTINE X 2)
Culture: NO GROWTH
Special Requests: ADEQUATE

## 2018-04-17 NOTE — Discharge Summary (Signed)
[facility] [place] [facility]   PATIENT NAME: Briana Cook    MR#:  [id number]  DATE OF BIRTH:  21-Jul-1934  DATE OF ADMISSION:  04/06/2018 ADMITTING PHYSICIAN: Oralia Manisavid Willis, MD  DATE OF DISCHARGE: 04/08/2018  5:19 PM  PRIMARY CARE PHYSICIAN: System, Pcp Not In   ADMISSION DIAGNOSIS:  Acute respiratory failure with hypoxia (HCC) [J96.01] Pneumonia of right lung due to infectious organism, unspecified part of lung [J18.9]  DISCHARGE DIAGNOSIS:  Principal Problem:   CAP (community acquired pneumonia) Active Problems:   HTN (hypertension)   Diabetes (HCC)   HLD (hyperlipidemia)   GERD (gastroesophageal reflux disease)   SECONDARY DIAGNOSIS:   Past Medical History:  Diagnosis Date  . Diabetes (HCC)   . GERD (gastroesophageal reflux disease)   . HLD (hyperlipidemia)   . Hypertension      ADMITTING HISTORY  HISTORY OF PRESENT ILLNESS:  Briana ApoMary Cook  is a 83 y.o. female who presents with chief complaint as above.  Presents with cough with some sputum production.  Patient states this is been getting worse for the past couple of days.  Here in the ED she was found to have pneumonia.  Hospitalist were called for admission  HOSPITAL COURSE:   *Right community-acquired pneumonia *Sepsis present on admission *Acute hypoxic respiratory failure *Hypertension *Diabetes mellitus *Hyperlipidemia  Patient was admitted to medical floor and started on IV antibiotics.  Due to wheezing she was also started on prednisone.  Oxygen to keep saturations over 92%.  Nebulizers as needed.  Patient improved well and by day of discharge has ambulated with room air and saturations over 92%.  Patient is being switched to oral prednisone and antibiotics and will be discharged home.  Diabetes was uncontrolled due to steroids but improving.  Hypertension well controlled.  Follow-up with primary care physician in 1 week.  CONSULTS OBTAINED:    DRUG ALLERGIES:   Allergies   Allergen Reactions  . Oxycodone Other (See Comments)    Pt reports "It makes me crazy"    DISCHARGE MEDICATIONS:   Allergies as of 04/08/2018      Reactions   Oxycodone Other (See Comments)   Pt reports "It makes me crazy"      Medication List    TAKE these medications   acetaminophen 500 MG tablet Commonly known as:  TYLENOL Take 1,000 mg by mouth every 6 (six) hours as needed for mild pain.   albuterol 108 (90 Base) MCG/ACT inhaler Commonly known as:  PROVENTIL HFA;VENTOLIN HFA Inhale 2 puffs into the lungs every 6 (six) hours as needed for wheezing or shortness of breath.   amLODipine 5 MG tablet Commonly known as:  NORVASC Take 5 mg by mouth daily.   aspirin EC 81 MG tablet Take 81 mg by mouth daily.   Calcium Carbonate-Vitamin D 600-400 MG-UNIT tablet Take 2 tablets by mouth daily.   DULoxetine 60 MG capsule Commonly known as:  CYMBALTA Take 60 mg by mouth daily.   guaiFENesin-dextromethorphan 100-10 MG/5ML syrup Commonly known as:  ROBITUSSIN DM Take 5 mLs by mouth every 4 (four) hours as needed for cough (chest congestion).   levofloxacin 500 MG tablet Commonly known as:  LEVAQUIN Take 1 tablet (500 mg total) by mouth daily.   metoprolol succinate 25 MG 24 hr tablet Commonly known as:  TOPROL-XL Take 25 mg by mouth 2 (two) times daily.   omeprazole 20 MG capsule Commonly known as:  PRILOSEC Take 1 capsule by mouth daily.   predniSONE  10 MG (21) Tbpk tablet Commonly known as:  STERAPRED UNI-PAK 21 TAB 6 tabs day 1 and taper 10 mg a day - 6 days   simvastatin 40 MG tablet Commonly known as:  ZOCOR Take 1 tablet by mouth every morning.   traMADol 50 MG tablet Commonly known as:  ULTRAM Take 100 mg by mouth every 8 (eight) hours as needed for moderate pain.   vitamin B-12 1000 MCG tablet Commonly known as:  CYANOCOBALAMIN Take 1,000 mcg by mouth daily.       Today   VITAL SIGNS:  Blood pressure (!) 151/72, pulse 98, temperature 97.7 F  (36.5 C), temperature source Axillary, resp. rate 20, height 5\' 4"  (1.626 m), weight 79.6 kg, SpO2 90 %.  I/O:  No intake or output data in the 24 hours ending 04/17/18 1500  PHYSICAL EXAMINATION:  Physical Exam  GENERAL:  83 y.o.-year-old patient lying in the bed with no acute distress.  LUNGS: Normal breath sounds bilaterally, no wheezing, rales,rhonchi or crepitation. No use of accessory muscles of respiration.  CARDIOVASCULAR: S1, S2 normal. No murmurs, rubs, or gallops.  ABDOMEN: Soft, non-tender, non-distended. Bowel sounds present. No organomegaly or mass.  NEUROLOGIC: Moves all 4 extremities. PSYCHIATRIC: The patient is alert and oriented x 3.  SKIN: No obvious rash, lesion, or ulcer.   DATA REVIEW:   CBC No results for input(s): WBC, HGB, HCT, PLT in the last 168 hours.  Chemistries  No results for input(s): NA, K, CL, CO2, GLUCOSE, BUN, CREATININE, CALCIUM, MG, AST, ALT, ALKPHOS, BILITOT in the last 168 hours.  Invalid input(s): GFRCGP  Cardiac Enzymes No results for input(s): TROPONINI in the last 168 hours.  Microbiology Results  Results for orders placed or performed during the hospital encounter of 04/06/18  Blood Culture (routine x 2)     Status: None   Collection Time: 04/06/18  9:01 PM  Result Value Ref Range Status   Specimen Description BLOOD RIGHT ARM  Final   Special Requests   Final    BOTTLES DRAWN AEROBIC AND ANAEROBIC Blood Culture adequate volume   Culture   Final    NO GROWTH 5 DAYS Performed at [facility], [address] [place], [place] [address]    Report Status 04/11/2018 FINAL  Final  Urine culture     Status: Abnormal   Collection Time: 04/06/18  9:01 PM  Result Value Ref Range Status   Specimen Description   Final    URINE, RANDOM Performed at [facility], [address] [place], [place] [address]    Special Requests   Final    NONE Performed at [facility], [address] [place], [place]  [address]    Culture 10,000 COLONIES/mL STAPHYLOCOCCUS EPIDERMIDIS (A)  Final   Report Status 04/09/2018 FINAL  Final   Organism ID, Bacteria STAPHYLOCOCCUS EPIDERMIDIS (A)  Final      Susceptibility   Staphylococcus epidermidis - MIC*    CIPROFLOXACIN <=0.5 SENSITIVE Sensitive     GENTAMICIN <=0.5 SENSITIVE Sensitive     NITROFURANTOIN <=16 SENSITIVE Sensitive     OXACILLIN >=4 RESISTANT Resistant     TETRACYCLINE 2 SENSITIVE Sensitive     VANCOMYCIN 1 SENSITIVE Sensitive     TRIMETH/SULFA >=320 RESISTANT Resistant     CLINDAMYCIN <=0.25 SENSITIVE Sensitive     RIFAMPIN <=0.5 SENSITIVE Sensitive     Inducible Clindamycin NEGATIVE Sensitive     * 10,000 COLONIES/mL STAPHYLOCOCCUS EPIDERMIDIS  Blood Culture (routine x 2)  Status: None   Collection Time: 04/07/18  6:31 AM  Result Value Ref Range Status   Specimen Description BLOOD LEFT WRIST  Final   Special Requests   Final    BOTTLES DRAWN AEROBIC AND ANAEROBIC Blood Culture adequate volume   Culture   Final    NO GROWTH 5 DAYS Performed at [facility], [address] [place], [place] [address]    Report Status 04/12/2018 FINAL  Final    RADIOLOGY:  No results found.  Follow up with PCP in 1 week.  Management plans discussed with the patient, family and they are in agreement.  CODE STATUS:  Code Status History    Date Active Date Inactive Code Status Order ID Comments User Context   04/07/2018 0035 04/08/2018 2025 Full Code [id number]  Oralia Manis, MD ED   03/01/2016 1853 03/04/2016 1714 Full Code [id number]  Katharina Caper, MD Inpatient    Advance Directive Documentation     Most Recent Value  Type of Advance Directive  Living will, Healthcare Power of Attorney  Pre-existing out of facility DNR order (yellow form or pink MOST form)  -  "MOST" Form in Place?  -      TOTAL TIME TAKING CARE OF THIS PATIENT ON DAY OF DISCHARGE: more than 30 minutes.   Molinda Bailiff Maddock Finigan M.D on 04/17/2018 at 3:00 PM  Between  7am to 6pm - Pager - [phone]  After 6pm go to [email or web address] - password [email or web address]  [facility] [place] [facility]  Office  [phone]  CC: Primary care physician; System, Pcp Not In  Note: This dictation was prepared with Dragon dictation along with smaller phrase technology. Any transcriptional errors that result from this process are unintentional.

## 2019-01-17 ENCOUNTER — Encounter: Payer: Self-pay | Admitting: *Deleted

## 2019-01-17 ENCOUNTER — Other Ambulatory Visit
Admission: RE | Admit: 2019-01-17 | Discharge: 2019-01-17 | Disposition: A | Discharge status: Home / Self Care | Payer: Medicare Other | Source: Ambulatory Visit | Attending: Ophthalmology | Admitting: Ophthalmology

## 2019-01-17 ENCOUNTER — Other Ambulatory Visit: Payer: Self-pay

## 2019-01-17 DIAGNOSIS — Z20828 Contact with and (suspected) exposure to other viral communicable diseases: Secondary | ICD-10-CM | POA: Insufficient documentation

## 2019-01-17 DIAGNOSIS — Z01812 Encounter for preprocedural laboratory examination: Secondary | ICD-10-CM | POA: Insufficient documentation

## 2019-01-18 LAB — SARS CORONAVIRUS 2 (TAT 6-24 HRS): SARS Coronavirus 2: NEGATIVE

## 2019-01-20 ENCOUNTER — Other Ambulatory Visit: Payer: Medicare Other

## 2019-01-23 ENCOUNTER — Encounter: Payer: Self-pay | Admitting: *Deleted

## 2019-01-23 ENCOUNTER — Ambulatory Visit: Payer: Medicare Other | Admitting: Anesthesiology

## 2019-01-23 ENCOUNTER — Encounter
Admission: RE | Disposition: A | Discharge status: Home / Self Care | Payer: Self-pay | Source: Home / Self Care | Attending: Ophthalmology

## 2019-01-23 ENCOUNTER — Ambulatory Visit
Admission: RE | Admit: 2019-01-23 | Discharge: 2019-01-23 | Disposition: A | Discharge status: Home / Self Care | Payer: Medicare Other | Attending: Ophthalmology | Admitting: Ophthalmology

## 2019-01-23 DIAGNOSIS — Z79891 Long term (current) use of opiate analgesic: Secondary | ICD-10-CM | POA: Insufficient documentation

## 2019-01-23 DIAGNOSIS — E1136 Type 2 diabetes mellitus with diabetic cataract: Secondary | ICD-10-CM | POA: Insufficient documentation

## 2019-01-23 DIAGNOSIS — H9190 Unspecified hearing loss, unspecified ear: Secondary | ICD-10-CM | POA: Diagnosis not present

## 2019-01-23 DIAGNOSIS — E785 Hyperlipidemia, unspecified: Secondary | ICD-10-CM | POA: Diagnosis not present

## 2019-01-23 DIAGNOSIS — Z96611 Presence of right artificial shoulder joint: Secondary | ICD-10-CM | POA: Insufficient documentation

## 2019-01-23 DIAGNOSIS — H2511 Age-related nuclear cataract, right eye: Secondary | ICD-10-CM | POA: Diagnosis not present

## 2019-01-23 DIAGNOSIS — F329 Major depressive disorder, single episode, unspecified: Secondary | ICD-10-CM | POA: Diagnosis not present

## 2019-01-23 DIAGNOSIS — Z79899 Other long term (current) drug therapy: Secondary | ICD-10-CM | POA: Diagnosis not present

## 2019-01-23 DIAGNOSIS — K219 Gastro-esophageal reflux disease without esophagitis: Secondary | ICD-10-CM | POA: Diagnosis not present

## 2019-01-23 DIAGNOSIS — Z96651 Presence of right artificial knee joint: Secondary | ICD-10-CM | POA: Insufficient documentation

## 2019-01-23 DIAGNOSIS — I10 Essential (primary) hypertension: Secondary | ICD-10-CM | POA: Insufficient documentation

## 2019-01-23 DIAGNOSIS — Z7982 Long term (current) use of aspirin: Secondary | ICD-10-CM | POA: Diagnosis not present

## 2019-01-23 HISTORY — DX: Age-related osteoporosis without current pathological fracture: M81.0

## 2019-01-23 HISTORY — DX: Depression, unspecified: F32.A

## 2019-01-23 HISTORY — PX: CATARACT EXTRACTION W/PHACO: SHX586

## 2019-01-23 HISTORY — DX: Unspecified osteoarthritis, unspecified site: M19.90

## 2019-01-23 LAB — GLUCOSE, CAPILLARY
Glucose-Capillary: 91 mg/dL (ref 70–99)
Glucose-Capillary: 93 mg/dL (ref 70–99)

## 2019-01-23 SURGERY — PHACOEMULSIFICATION, CATARACT, WITH IOL INSERTION
Anesthesia: Monitor Anesthesia Care | Site: Eye | Laterality: Right

## 2019-01-23 MED ORDER — MIDAZOLAM HCL 2 MG/2ML IJ SOLN
INTRAMUSCULAR | Status: DC | PRN
  Administered 2019-01-23: 1 mg via INTRAVENOUS
  Administered 2019-01-23 (×2): 0.5 mg via INTRAVENOUS

## 2019-01-23 MED ORDER — TRYPAN BLUE 0.06 % OP SOLN
OPHTHALMIC | Status: AC
  Filled 2019-01-23: qty 0.5

## 2019-01-23 MED ORDER — PHENYLEPHRINE HCL 10 % OP SOLN
1.0000 [drp] | OPHTHALMIC | Status: DC | PRN
  Administered 2019-01-23: 10:00:00 1 [drp] via OPHTHALMIC

## 2019-01-23 MED ORDER — POVIDONE-IODINE 5 % OP SOLN
OPHTHALMIC | Status: AC
  Filled 2019-01-23: qty 30

## 2019-01-23 MED ORDER — NA HYALUR & NA CHOND-NA HYALUR 0.55-0.5 ML IO KIT
PACK | INTRAOCULAR | Status: AC
  Filled 2019-01-23: qty 1.05

## 2019-01-23 MED ORDER — CARBACHOL 0.01 % IO SOLN
INTRAOCULAR | Status: DC | PRN
  Administered 2019-01-23: .5 mL via INTRAOCULAR

## 2019-01-23 MED ORDER — EPINEPHRINE PF 1 MG/ML IJ SOLN
INTRAOCULAR | Status: DC | PRN
  Administered 2019-01-23: 1 mL via OPHTHALMIC

## 2019-01-23 MED ORDER — CYCLOPENTOLATE HCL 2 % OP SOLN
OPHTHALMIC | Status: AC
  Filled 2019-01-23: qty 2

## 2019-01-23 MED ORDER — LIDOCAINE HCL (PF) 4 % IJ SOLN
INTRAOCULAR | Status: DC | PRN
  Administered 2019-01-23: 2.25 mL via OPHTHALMIC

## 2019-01-23 MED ORDER — MOXIFLOXACIN HCL 0.5 % OP SOLN
OPHTHALMIC | Status: AC
  Filled 2019-01-23: qty 3

## 2019-01-23 MED ORDER — NA HYALUR & NA CHOND-NA HYALUR 0.55-0.5 ML IO KIT
PACK | INTRAOCULAR | Status: DC | PRN
  Administered 2019-01-23: 1 via OPHTHALMIC

## 2019-01-23 MED ORDER — SODIUM CHLORIDE 0.9 % IV SOLN
INTRAVENOUS | Status: DC
  Administered 2019-01-23: 11:00:00 via INTRAVENOUS

## 2019-01-23 MED ORDER — TETRACAINE HCL 0.5 % OP SOLN
OPHTHALMIC | Status: AC
  Filled 2019-01-23: qty 4

## 2019-01-23 MED ORDER — NA CHONDROIT SULF-NA HYALURON 40-17 MG/ML IO SOLN
INTRAOCULAR | Status: DC | PRN
  Administered 2019-01-23: 1 mL via INTRAOCULAR

## 2019-01-23 MED ORDER — EPINEPHRINE PF 1 MG/ML IJ SOLN
INTRAMUSCULAR | Status: AC
  Filled 2019-01-23: qty 1

## 2019-01-23 MED ORDER — ONDANSETRON HCL 4 MG/2ML IJ SOLN
INTRAMUSCULAR | Status: DC | PRN
  Administered 2019-01-23: 4 mg via INTRAVENOUS

## 2019-01-23 MED ORDER — LIDOCAINE HCL (PF) 4 % IJ SOLN
INTRAMUSCULAR | Status: AC
  Filled 2019-01-23: qty 5

## 2019-01-23 MED ORDER — PHENYLEPHRINE HCL 10 % OP SOLN
OPHTHALMIC | Status: AC
  Filled 2019-01-23: qty 5

## 2019-01-23 MED ORDER — DORZOLAMIDE HCL-TIMOLOL MAL 2-0.5 % OP SOLN
OPHTHALMIC | Status: DC | PRN
  Administered 2019-01-23: 2 [drp] via OPHTHALMIC

## 2019-01-23 MED ORDER — ONDANSETRON HCL 4 MG/2ML IJ SOLN
INTRAMUSCULAR | Status: AC
  Filled 2019-01-23: qty 6

## 2019-01-23 MED ORDER — ARMC OPHTHALMIC DILATING DROPS
1.0000 "application " | OPHTHALMIC | Status: AC
  Administered 2019-01-23 (×3): 1 via OPHTHALMIC

## 2019-01-23 MED ORDER — ARMC OPHTHALMIC DILATING DROPS
OPHTHALMIC | Status: AC
  Filled 2019-01-23: qty 0.5

## 2019-01-23 MED ORDER — TRYPAN BLUE 0.06 % OP SOLN
OPHTHALMIC | Status: DC | PRN
  Administered 2019-01-23: .5 mL via INTRAOCULAR

## 2019-01-23 MED ORDER — MIDAZOLAM HCL 2 MG/2ML IJ SOLN
INTRAMUSCULAR | Status: AC
  Filled 2019-01-23: qty 2

## 2019-01-23 MED ORDER — TETRACAINE HCL 0.5 % OP SOLN
1.0000 [drp] | OPHTHALMIC | Status: AC | PRN
  Administered 2019-01-23 (×2): 1 [drp] via OPHTHALMIC

## 2019-01-23 MED ORDER — CYCLOPENTOLATE HCL 2 % OP SOLN
1.0000 [drp] | OPHTHALMIC | Status: DC | PRN
  Administered 2019-01-23: 1 [drp] via OPHTHALMIC

## 2019-01-23 MED ORDER — MOXIFLOXACIN HCL 0.5 % OP SOLN
1.0000 [drp] | Freq: Once | OPHTHALMIC | Status: AC
  Administered 2019-01-23: .2 mL via OPHTHALMIC
  Filled 2019-01-23: qty 3

## 2019-01-23 MED ORDER — NA CHONDROIT SULF-NA HYALURON 40-17 MG/ML IO SOLN
INTRAOCULAR | Status: AC
  Filled 2019-01-23: qty 1

## 2019-01-23 MED ORDER — FENTANYL CITRATE (PF) 100 MCG/2ML IJ SOLN
INTRAMUSCULAR | Status: AC
  Filled 2019-01-23: qty 2

## 2019-01-23 MED ORDER — POVIDONE-IODINE 5 % OP SOLN
OPHTHALMIC | Status: DC | PRN
  Administered 2019-01-23: 1 via OPHTHALMIC

## 2019-01-23 SURGICAL SUPPLY — 17 items
DISSECTOR HYDRO NUCLEUS 50X22 (MISCELLANEOUS) ×12 IMPLANT
DRSG TEGADERM 2-3/8X2-3/4 SM (GAUZE/BANDAGES/DRESSINGS) ×3 IMPLANT
GLOVE BIOGEL M 6.5 STRL (GLOVE) ×3 IMPLANT
GOWN STRL REUS W/ TWL LRG LVL3 (GOWN DISPOSABLE) ×1 IMPLANT
GOWN STRL REUS W/ TWL XL LVL3 (GOWN DISPOSABLE) ×1 IMPLANT
GOWN STRL REUS W/TWL LRG LVL3 (GOWN DISPOSABLE) ×2
GOWN STRL REUS W/TWL XL LVL3 (GOWN DISPOSABLE) ×2
KNIFE 45D UP 2.3 (MISCELLANEOUS) ×3 IMPLANT
LABEL CATARACT MEDS ST (LABEL) ×3 IMPLANT
LENS IOL TECNIS ITEC 22.0 (Intraocular Lens) ×2 IMPLANT
PACK CATARACT (MISCELLANEOUS) ×3 IMPLANT
PACK CATARACT KING (MISCELLANEOUS) ×3 IMPLANT
PACK EYE AFTER SURG (MISCELLANEOUS) ×3 IMPLANT
SOL BSS BAG (MISCELLANEOUS) ×3
SOLUTION BSS BAG (MISCELLANEOUS) ×1 IMPLANT
WATER STERILE IRR 250ML POUR (IV SOLUTION) ×3 IMPLANT
WIPE NON LINTING 3.25X3.25 (MISCELLANEOUS) ×3 IMPLANT

## 2019-01-23 NOTE — H&P (Signed)
   I have reviewed the patient's H&P and agree with its findings. There have been no interval changes.  Leroy Trim MD Ophthalmology 

## 2019-01-23 NOTE — Anesthesia Postprocedure Evaluation (Signed)
Anesthesia Post Note  Patient: [place]  Procedure(s) Performed: CATARACT EXTRACTION PHACO AND INTRAOCULAR LENS PLACEMENT (IOC) RIGHT, MALYUGIN (Right Eye)  Patient location during evaluation: Phase II Anesthesia Type: MAC Level of consciousness: awake and alert Pain management: pain level controlled Vital Signs Assessment: post-procedure vital signs reviewed and stable Respiratory status: spontaneous breathing, nonlabored ventilation and respiratory function stable Cardiovascular status: blood pressure returned to baseline and stable Postop Assessment: no apparent nausea or vomiting Anesthetic complications: no     Last Vitals:  Vitals:   01/23/19 0907 01/23/19 1139  BP: (!) 159/70 (!) 166/59  Pulse: 71 67  Resp: 16 16  Temp: 37.2 C 36.7 C  SpO2: 96% 98%    Last Pain:  Vitals:   01/23/19 1139  TempSrc: Temporal  PainSc: 0-No pain                 Alphonsus Sias

## 2019-01-23 NOTE — Transfer of Care (Signed)
Immediate Anesthesia Transfer of Care Note  Patient: [place]  Procedure(s) Performed: CATARACT EXTRACTION PHACO AND INTRAOCULAR LENS PLACEMENT (IOC) RIGHT, MALYUGIN (Right Eye)  Patient Location: PACU  Anesthesia Type:MAC  Level of Consciousness: awake, alert , oriented and patient cooperative  Airway & Oxygen Therapy: Patient Spontanous Breathing  Post-op Assessment: Report given to RN  Post vital signs: Reviewed and stable  Last Vitals:  Vitals Value Taken Time  BP 166/59 01/23/19 1139  Temp 36.7 C 01/23/19 1139  Pulse 67 01/23/19 1139  Resp 16 01/23/19 1139  SpO2 98 % 01/23/19 1139    Last Pain:  Vitals:   01/23/19 1139  TempSrc: Temporal  PainSc: 0-No pain         Complications: No apparent anesthesia complications

## 2019-01-23 NOTE — Discharge Instructions (Addendum)
Eye Surgery Discharge Instructions  Expect mild scratchy sensation or mild soreness. DO NOT RUB YOUR EYE!  The day of surgery:  Minimal physical activity, but bed rest is not required  No reading, computer work, or close hand work  No bending, lifting, or straining.  May watch TV  For 24 hours:  No driving, legal decisions, or alcoholic beverages  Safety precautions  Eat anything you prefer: It is better to start with liquids, then soup then solid foods.  Solar shield eyeglasses should be worn for comfort in the sunlight/patch while sleeping  Resume all regular medications including aspirin or Coumadin if these were discontinued prior to surgery. You may shower, bathe, shave, or wash your hair. Tylenol may be taken for mild discomfort. Follow eye drop instruction sheet as reviewed.  Call your doctor if you experience significant pain, nausea, or vomiting, fever > 101 or other signs of infection. [phone] or [phone] Specific instructions:  Follow-up Information    Marchia Meiers, MD Follow up.   Specialty: Ophthalmology Why: 01-24-19 @ 10:15 am  Contact information: [address] [place]  [address] [phone]

## 2019-01-23 NOTE — Anesthesia Post-op Follow-up Note (Signed)
Anesthesia QCDR form completed.        

## 2019-01-23 NOTE — Anesthesia Preprocedure Evaluation (Signed)
Anesthesia Evaluation  Patient identified by MRN, date of birth, ID band Patient awake    Reviewed: Allergy & Precautions, H&P , NPO status , reviewed documented beta blocker date and time   Airway Mallampati: II  TM Distance: >3 FB Neck ROM: limited    Dental  (+) Partial Upper   Pulmonary pneumonia, resolved,    Pulmonary exam normal        Cardiovascular hypertension, Normal cardiovascular exam  ECHO Study Conclusions  - Left ventricle: The cavity size was normal. Systolic function was   normal. The estimated ejection fraction was 65%. Doppler   parameters are consistent with abnormal left ventricular   relaxation (grade 1 diastolic dysfunction). - Mitral valve: There was mild regurgitation. - Atrial septum: No defect or patent foramen ovale was identified.   Neuro/Psych PSYCHIATRIC DISORDERS Depression    GI/Hepatic GERD  Medicated and Controlled,  Endo/Other  diabetes  Renal/GU Renal disease     Musculoskeletal  (+) Arthritis ,   Abdominal   Peds  Hematology   Anesthesia Other Findings Past Medical History: No date: Arthritis No date: Depression No date: Diabetes ([place]) No date: GERD (gastroesophageal reflux disease) No date: HLD (hyperlipidemia) No date: Hypertension No date: Osteoporosis  Past Surgical History: No date: BREAST BIOPSY     Comment:  x2 No date: CHOLECYSTECTOMY No date: REPLACEMENT TOTAL KNEE; Right No date: TOTAL SHOULDER REPLACEMENT; Right  BMI    Body Mass Index: 30.65 kg/m      Reproductive/Obstetrics                             Anesthesia Physical Anesthesia Plan  ASA: III  Anesthesia Plan: MAC   Post-op Pain Management:    Induction: Intravenous  PONV Risk Score and Plan: 2 and Midazolam  Airway Management Planned: Nasal Cannula and Natural Airway  Additional Equipment:   Intra-op Plan:   Post-operative Plan:   Informed Consent:  I have reviewed the patients History and Physical, chart, labs and discussed the procedure including the risks, benefits and alternatives for the proposed anesthesia with the patient or authorized representative who has indicated his/her understanding and acceptance.     Dental Advisory Given  Plan Discussed with: CRNA  Anesthesia Plan Comments:         Anesthesia Quick Evaluation

## 2019-01-23 NOTE — Op Note (Signed)
  PREOPERATIVE DIAGNOSIS:  Nuclear sclerotic cataract of the RIGHT eye.   POSTOPERATIVE DIAGNOSIS:  Nuclear sclerotic cataract of the RIGHT eye.   OPERATIVE PROCEDURE: Cataract surgery OD   SURGEON:  Marchia Meiers, MD.   ANESTHESIA:  Anesthesiologist: Alphonsus Sias, MD CRNA: Kelton Pillar, CRNA  1.      Managed anesthesia care. 2.     0.77ml of Shugarcaine was instilled following the paracentesis   COMPLICATIONS:  None.   TECHNIQUE:   Divide and conquer   DESCRIPTION OF PROCEDURE:  The patient was examined and consented in the preoperative holding area where the aforementioned topical anesthesia was applied to the RIGHT eye and then brought back to the Operating Room where the RIGHT eye was prepped and draped in the usual sterile ophthalmic fashion and a lid speculum was placed. A paracentesis was created with the side port blade, the anterior chamber was washed out with trypan blue to stain the anterior capsule, and the anterior chamber was filled with viscoelastic. A near clear corneal incision was performed with the steel keratome. A continuous curvilinear capsulorrhexis was performed with a cystotome followed by the capsulorrhexis forceps. Hydrodissection and hydrodelineation were carried out with BSS on a blunt cannula. The lens was removed in a divide and conquer  technique and the remaining cortical material was removed with the irrigation-aspiration handpiece. The capsular bag was inflated with viscoelastic and the lens was placed in the capsular bag without complication. The remaining viscoelastic was removed from the eye with the irrigation-aspiration handpiece. The wounds were hydrated. The anterior chamber was flushed and the eye was inflated to physiologic pressure. 0.34ml Vigamox was placed in the anterior chamber. The wounds were found to be water tight. The eye was dressed with Vigamox. The patient was given protective glasses to wear throughout the day and a shield  with which to sleep tonight. The patient was also given drops with which to begin a drop regimen today and will follow-up with me in one day. Implant Name Type Inv. Item Serial No. Manufacturer Lot No. LRB No. Used Action  LENS IOL DIOP 22.0 - [id number] Intraocular Lens LENS IOL DIOP 22.0 [id number]  Right 1 Implanted    Procedure(s) with comments: CATARACT EXTRACTION PHACO AND INTRAOCULAR LENS PLACEMENT (IOC) RIGHT, MALYUGIN (Right) - [place]  01:33 CDE 13.28 Fluid pack lot # [id number]  Electronically signed: Marchia Meiers 01/23/2019 1:54 PM

## 2019-02-03 ENCOUNTER — Encounter: Payer: Self-pay | Admitting: *Deleted

## 2019-02-10 ENCOUNTER — Other Ambulatory Visit
Admission: RE | Admit: 2019-02-10 | Discharge: 2019-02-10 | Disposition: A | Discharge status: Home / Self Care | Payer: Medicare Other | Source: Ambulatory Visit | Attending: Ophthalmology | Admitting: Ophthalmology

## 2019-02-10 DIAGNOSIS — Z20828 Contact with and (suspected) exposure to other viral communicable diseases: Secondary | ICD-10-CM | POA: Diagnosis not present

## 2019-02-10 DIAGNOSIS — Z01812 Encounter for preprocedural laboratory examination: Secondary | ICD-10-CM | POA: Diagnosis present

## 2019-02-10 LAB — SARS CORONAVIRUS 2 (TAT 6-24 HRS): SARS Coronavirus 2: NEGATIVE

## 2019-02-13 ENCOUNTER — Ambulatory Visit
Admission: RE | Admit: 2019-02-13 | Discharge: 2019-02-13 | Disposition: A | Discharge status: Home / Self Care | Payer: Medicare Other | Attending: Ophthalmology | Admitting: Ophthalmology

## 2019-02-13 ENCOUNTER — Other Ambulatory Visit: Payer: Self-pay

## 2019-02-13 ENCOUNTER — Encounter: Payer: Self-pay | Admitting: *Deleted

## 2019-02-13 ENCOUNTER — Ambulatory Visit: Payer: Medicare Other | Admitting: Anesthesiology

## 2019-02-13 ENCOUNTER — Encounter
Admission: RE | Disposition: A | Discharge status: Home / Self Care | Payer: Self-pay | Source: Home / Self Care | Attending: Ophthalmology

## 2019-02-13 DIAGNOSIS — F329 Major depressive disorder, single episode, unspecified: Secondary | ICD-10-CM | POA: Diagnosis not present

## 2019-02-13 DIAGNOSIS — Z79899 Other long term (current) drug therapy: Secondary | ICD-10-CM | POA: Insufficient documentation

## 2019-02-13 DIAGNOSIS — Z96659 Presence of unspecified artificial knee joint: Secondary | ICD-10-CM | POA: Insufficient documentation

## 2019-02-13 DIAGNOSIS — M1990 Unspecified osteoarthritis, unspecified site: Secondary | ICD-10-CM | POA: Diagnosis not present

## 2019-02-13 DIAGNOSIS — I10 Essential (primary) hypertension: Secondary | ICD-10-CM | POA: Diagnosis not present

## 2019-02-13 DIAGNOSIS — Z885 Allergy status to narcotic agent status: Secondary | ICD-10-CM | POA: Insufficient documentation

## 2019-02-13 DIAGNOSIS — Z96619 Presence of unspecified artificial shoulder joint: Secondary | ICD-10-CM | POA: Diagnosis not present

## 2019-02-13 DIAGNOSIS — Z7982 Long term (current) use of aspirin: Secondary | ICD-10-CM | POA: Insufficient documentation

## 2019-02-13 DIAGNOSIS — E1136 Type 2 diabetes mellitus with diabetic cataract: Secondary | ICD-10-CM | POA: Diagnosis not present

## 2019-02-13 DIAGNOSIS — H2512 Age-related nuclear cataract, left eye: Secondary | ICD-10-CM | POA: Insufficient documentation

## 2019-02-13 DIAGNOSIS — E7800 Pure hypercholesterolemia, unspecified: Secondary | ICD-10-CM | POA: Insufficient documentation

## 2019-02-13 HISTORY — DX: Palpitations: R00.2

## 2019-02-13 HISTORY — DX: Wheezing: R06.2

## 2019-02-13 HISTORY — PX: CATARACT EXTRACTION W/PHACO: SHX586

## 2019-02-13 HISTORY — DX: Unspecified hearing loss, unspecified ear: H91.90

## 2019-02-13 HISTORY — DX: Nasal congestion: R09.81

## 2019-02-13 HISTORY — DX: Other allergic rhinitis: J30.89

## 2019-02-13 HISTORY — DX: Cough, unspecified: R05.9

## 2019-02-13 HISTORY — DX: Localized edema: R60.0

## 2019-02-13 SURGERY — PHACOEMULSIFICATION, CATARACT, WITH IOL INSERTION
Anesthesia: Monitor Anesthesia Care | Laterality: Left

## 2019-02-13 MED ORDER — POVIDONE-IODINE 5 % OP SOLN
OPHTHALMIC | Status: AC
  Filled 2019-02-13: qty 30

## 2019-02-13 MED ORDER — LIDOCAINE HCL (PF) 4 % IJ SOLN
INTRAMUSCULAR | Status: AC
  Filled 2019-02-13: qty 5

## 2019-02-13 MED ORDER — EPINEPHRINE PF 1 MG/ML IJ SOLN
INTRAOCULAR | Status: DC | PRN
  Administered 2019-02-13: 12:00:00 200 mL via OPHTHALMIC

## 2019-02-13 MED ORDER — NA CHONDROIT SULF-NA HYALURON 40-17 MG/ML IO SOLN
INTRAOCULAR | Status: AC
  Filled 2019-02-13: qty 1

## 2019-02-13 MED ORDER — NA HYALUR & NA CHOND-NA HYALUR 0.55-0.5 ML IO KIT
PACK | INTRAOCULAR | Status: AC
  Filled 2019-02-13: qty 1.05

## 2019-02-13 MED ORDER — MOXIFLOXACIN HCL 0.5 % OP SOLN: 1.0000 [drp] | OPHTHALMIC | Status: DC | PRN

## 2019-02-13 MED ORDER — NEOMYCIN-POLYMYXIN-DEXAMETH 3.5-10000-0.1 OP OINT
TOPICAL_OINTMENT | OPHTHALMIC | Status: AC
  Filled 2019-02-13: qty 3.5

## 2019-02-13 MED ORDER — SODIUM CHLORIDE 0.9 % IV SOLN
INTRAVENOUS | Status: DC
  Administered 2019-02-13 (×2): via INTRAVENOUS

## 2019-02-13 MED ORDER — EPINEPHRINE PF 1 MG/ML IJ SOLN
INTRAMUSCULAR | Status: AC
  Filled 2019-02-13: qty 1

## 2019-02-13 MED ORDER — FENTANYL CITRATE (PF) 100 MCG/2ML IJ SOLN
INTRAMUSCULAR | Status: DC | PRN
  Administered 2019-02-13: 50 ug via INTRAVENOUS

## 2019-02-13 MED ORDER — ARMC OPHTHALMIC DILATING DROPS
1.0000 "application " | OPHTHALMIC | Status: AC
  Administered 2019-02-13 (×3): 1 via OPHTHALMIC

## 2019-02-13 MED ORDER — MOXIFLOXACIN HCL 0.5 % OP SOLN
OPHTHALMIC | Status: AC
  Filled 2019-02-13: qty 3

## 2019-02-13 MED ORDER — TRYPAN BLUE 0.06 % OP SOLN
OPHTHALMIC | Status: AC
  Filled 2019-02-13: qty 0.5

## 2019-02-13 MED ORDER — MIDAZOLAM HCL 2 MG/2ML IJ SOLN
INTRAMUSCULAR | Status: AC
  Filled 2019-02-13: qty 2

## 2019-02-13 MED ORDER — MIDAZOLAM HCL 2 MG/2ML IJ SOLN
INTRAMUSCULAR | Status: DC | PRN
  Administered 2019-02-13: 0.5 mg via INTRAVENOUS

## 2019-02-13 MED ORDER — FENTANYL CITRATE (PF) 100 MCG/2ML IJ SOLN
INTRAMUSCULAR | Status: AC
  Filled 2019-02-13: qty 2

## 2019-02-13 MED ORDER — ARMC OPHTHALMIC DILATING DROPS
OPHTHALMIC | Status: AC
  Administered 2019-02-13: 1 via OPHTHALMIC
  Filled 2019-02-13: qty 0.5

## 2019-02-13 MED ORDER — POVIDONE-IODINE 5 % OP SOLN
OPHTHALMIC | Status: DC | PRN
  Administered 2019-02-13: 1 via OPHTHALMIC

## 2019-02-13 MED ORDER — TETRACAINE HCL 0.5 % OP SOLN
1.0000 [drp] | OPHTHALMIC | Status: DC | PRN
  Administered 2019-02-13: 10:00:00 1 [drp] via OPHTHALMIC

## 2019-02-13 MED ORDER — NA CHONDROIT SULF-NA HYALURON 40-17 MG/ML IO SOLN
INTRAOCULAR | Status: DC | PRN
  Administered 2019-02-13: 1 mL via INTRAOCULAR

## 2019-02-13 MED ORDER — MOXIFLOXACIN HCL 0.5 % OP SOLN
OPHTHALMIC | Status: DC | PRN
  Administered 2019-02-13: 0.2 mL via OPHTHALMIC

## 2019-02-13 MED ORDER — LIDOCAINE HCL (PF) 4 % IJ SOLN
INTRAOCULAR | Status: DC | PRN
  Administered 2019-02-13: 12:00:00 2.5 mL via OPHTHALMIC

## 2019-02-13 MED ORDER — TETRACAINE HCL 0.5 % OP SOLN
OPHTHALMIC | Status: AC
  Administered 2019-02-13: 10:00:00 1 [drp] via OPHTHALMIC
  Filled 2019-02-13: qty 4

## 2019-02-13 MED ORDER — TRYPAN BLUE 0.06 % OP SOLN
OPHTHALMIC | Status: DC | PRN
  Administered 2019-02-13: 0.5 mL via INTRAOCULAR

## 2019-02-13 MED ORDER — NA HYALUR & NA CHOND-NA HYALUR 0.55-0.5 ML IO KIT
PACK | INTRAOCULAR | Status: DC | PRN
  Administered 2019-02-13: 1 via INTRAOCULAR

## 2019-02-13 SURGICAL SUPPLY — 17 items
DISSECTOR HYDRO NUCLEUS 50X22 (MISCELLANEOUS) ×12 IMPLANT
DRSG TEGADERM 2-3/8X2-3/4 SM (GAUZE/BANDAGES/DRESSINGS) ×5 IMPLANT
GLOVE BIOGEL M 6.5 STRL (GLOVE) ×3 IMPLANT
GOWN STRL REUS W/ TWL LRG LVL3 (GOWN DISPOSABLE) ×1 IMPLANT
GOWN STRL REUS W/ TWL XL LVL3 (GOWN DISPOSABLE) ×1 IMPLANT
GOWN STRL REUS W/TWL LRG LVL3 (GOWN DISPOSABLE) ×2
GOWN STRL REUS W/TWL XL LVL3 (GOWN DISPOSABLE) ×2
KNIFE 45D UP 2.3 (MISCELLANEOUS) ×3 IMPLANT
LABEL CATARACT MEDS ST (LABEL) ×3 IMPLANT
LENS IOL TECNIS ITEC 23.5 (Intraocular Lens) ×2 IMPLANT
PACK CATARACT (MISCELLANEOUS) ×3 IMPLANT
PACK CATARACT KING (MISCELLANEOUS) ×3 IMPLANT
PACK EYE AFTER SURG (MISCELLANEOUS) ×3 IMPLANT
SOL BSS BAG (MISCELLANEOUS) ×3
SOLUTION BSS BAG (MISCELLANEOUS) ×1 IMPLANT
WATER STERILE IRR 250ML POUR (IV SOLUTION) ×3 IMPLANT
WIPE NON LINTING 3.25X3.25 (MISCELLANEOUS) ×3 IMPLANT

## 2019-02-13 NOTE — Op Note (Signed)
PREOPERATIVE DIAGNOSIS:  Nuclear sclerotic cataract of the LEFT eye.   POSTOPERATIVE DIAGNOSIS:  Nuclear sclerotic cataract of the LEFT eye.   OPERATIVE PROCEDURE: Cataract surgery OS   SURGEON:  Marchia Meiers, MD.   ANESTHESIA:  Anesthesiologist: Alvin Critchley, MD CRNA: Allean Found, CRNA  1.      Managed anesthesia care. 2.     0.28ml of Shugarcaine was instilled following the paracentesis   COMPLICATIONS:  None.   TECHNIQUE:   Divide and conquer   DESCRIPTION OF PROCEDURE:  The patient was examined and consented in the preoperative holding area where the aforementioned topical anesthesia was applied to the LEFT eye and then brought back to the Operating Room where the left eye was prepped and draped in the usual sterile ophthalmic fashion and a lid speculum was placed. A paracentesis was created with the side port blade, the anterior chamber was washed out with trypan blue to stain the anterior capsule, and the anterior chamber was filled with viscoelastic. A near clear corneal incision was performed with the steel keratome. A continuous curvilinear capsulorrhexis was performed with a cystotome followed by the capsulorrhexis forceps. Hydrodissection and hydrodelineation were carried out with BSS on a blunt cannula. The lens was removed in a divide and conquer  technique and the remaining cortical material was removed with the irrigation-aspiration handpiece. The capsular bag was inflated with viscoelastic and the lens was placed in the capsular bag without complication. The remaining viscoelastic was removed from the eye with the irrigation-aspiration handpiece. The wounds were hydrated. The anterior chamber was flushed and the eye was inflated to physiologic pressure. 0.56ml Vigamox was placed in the anterior chamber. The wounds were found to be water tight. The eye was dressed with Vigamox. The patient was given protective glasses to wear throughout the day and a shield with which to sleep  tonight. The patient was also given drops with which to begin a drop regimen today and will follow-up with me in one day. Implant Name Type Inv. Item Serial No. Manufacturer Lot No. LRB No. Used Action  LENS IOL DIOP 23.5 - [id number] Intraocular Lens LENS IOL DIOP 23.5 [id number] [place]  Left 1 Implanted    Procedure(s) with comments: CATARACT EXTRACTION PHACO AND INTRAOCULAR LENS PLACEMENT (IOC) LEFT VISION BLUE (Left) - Lot # [id number] H [place]: 01:10.5 CDE: 11.53  Electronically signed: Marchia Meiers 02/13/2019 12:24 PM

## 2019-02-13 NOTE — H&P (Signed)
   I have reviewed the patient's H&P and agree with its findings. There have been no interval changes.  Julyssa Kyer MD Ophthalmology 

## 2019-02-13 NOTE — Anesthesia Preprocedure Evaluation (Signed)
Anesthesia Evaluation  Patient identified by MRN, date of birth, ID band Patient awake    Reviewed: Allergy & Precautions, H&P , NPO status , reviewed documented beta blocker date and time   Airway Mallampati: II  TM Distance: >3 FB Neck ROM: limited    Dental  (+) Partial Upper   Pulmonary pneumonia, resolved,    Pulmonary exam normal        Cardiovascular hypertension, Normal cardiovascular exam  ECHO Study Conclusions  - Left ventricle: The cavity size was normal. Systolic function was   normal. The estimated ejection fraction was 65%. Doppler   parameters are consistent with abnormal left ventricular   relaxation (grade 1 diastolic dysfunction). - Mitral valve: There was mild regurgitation. - Atrial septum: No defect or patent foramen ovale was identified.   Neuro/Psych PSYCHIATRIC DISORDERS Depression    GI/Hepatic GERD  Medicated and Controlled,  Endo/Other  diabetes  Renal/GU Renal disease     Musculoskeletal  (+) Arthritis ,   Abdominal   Peds  Hematology   Anesthesia Other Findings Past Medical History: No date: Arthritis No date: Depression No date: Diabetes ([facility]) No date: GERD (gastroesophageal reflux disease) No date: HLD (hyperlipidemia) No date: Hypertension No date: Osteoporosis  Past Surgical History: No date: BREAST BIOPSY     Comment:  x2 No date: CHOLECYSTECTOMY No date: REPLACEMENT TOTAL KNEE; Right No date: TOTAL SHOULDER REPLACEMENT; Right  BMI    Body Mass Index: 30.65 kg/m      Reproductive/Obstetrics                             Anesthesia Physical Anesthesia Plan  ASA: III  Anesthesia Plan: MAC   Post-op Pain Management:    Induction: Intravenous  PONV Risk Score and Plan: 2 and Midazolam  Airway Management Planned: Nasal Cannula and Natural Airway  Additional Equipment:   Intra-op Plan:   Post-operative Plan:   Informed Consent:  I have reviewed the patients History and Physical, chart, labs and discussed the procedure including the risks, benefits and alternatives for the proposed anesthesia with the patient or authorized representative who has indicated his/her understanding and acceptance.     Dental Advisory Given  Plan Discussed with: CRNA  Anesthesia Plan Comments:         Anesthesia Quick Evaluation  

## 2019-02-13 NOTE — Discharge Instructions (Signed)
General Anesthesia, Adult, Care After °This sheet gives you information about how to care for yourself after your procedure. Your health care provider may also give you more specific instructions. If you have problems or questions, contact your health care provider. °What can I expect after the procedure? °After the procedure, the following side effects are common: °· Pain or discomfort at the IV site. °· Nausea. °· Vomiting. °· Sore throat. °· Trouble concentrating. °· Feeling cold or chills. °· Weak or tired. °· Sleepiness and fatigue. °· Soreness and body aches. These side effects can affect parts of the body that were not involved in surgery. °Follow these instructions at home: ° °For at least 24 hours after the procedure: °· Have a responsible adult stay with you. It is important to have someone help care for you until you are awake and alert. °· Rest as needed. °· Do not: °? Participate in activities in which you could fall or become injured. °? Drive. °? Use heavy machinery. °? Drink alcohol. °? Take sleeping pills or medicines that cause drowsiness. °? Make important decisions or sign legal documents. °? Take care of children on your own. °Eating and drinking °· Follow any instructions from your health care provider about eating or drinking restrictions. °· When you feel hungry, start by eating small amounts of foods that are soft and easy to digest (bland), such as toast. Gradually return to your regular diet. °· Drink enough fluid to keep your urine pale yellow. °· If you vomit, rehydrate by drinking water, juice, or clear broth. °General instructions °· If you have sleep apnea, surgery and certain medicines can increase your risk for breathing problems. Follow instructions from your health care provider about wearing your sleep device: °? Anytime you are sleeping, including during daytime naps. °? While taking prescription pain medicines, sleeping medicines, or medicines that make you drowsy. °· Return to  your normal activities as told by your health care provider. Ask your health care provider what activities are safe for you. °· Take over-the-counter and prescription medicines only as told by your health care provider. °· If you smoke, do not smoke without supervision. °· Keep all follow-up visits as told by your health care provider. This is important. °Contact a health care provider if: °· You have nausea or vomiting that does not get better with medicine. °· You cannot eat or drink without vomiting. °· You have pain that does not get better with medicine. °· You are unable to pass urine. °· You develop a skin rash. °· You have a fever. °· You have redness around your IV site that gets worse. °Get help right away if: °· You have difficulty breathing. °· You have chest pain. °· You have blood in your urine or stool, or you vomit blood. °Summary °· After the procedure, it is common to have a sore throat or nausea. It is also common to feel tired. °· Have a responsible adult stay with you for the first 24 hours after general anesthesia. It is important to have someone help care for you until you are awake and alert. °· When you feel hungry, start by eating small amounts of foods that are soft and easy to digest (bland), such as toast. Gradually return to your regular diet. °· Drink enough fluid to keep your urine pale yellow. °· Return to your normal activities as told by your health care provider. Ask your health care provider what activities are safe for you. °This information is not   intended to replace advice given to you by your health care provider. Make sure you discuss any questions you have with your health care provider. °Document Released: 07/31/2000 Document Revised: 04/27/2017 Document Reviewed: 12/08/2016 °Elsevier Patient Education © 2020 [employer] °Cataract Surgery, Care After °This sheet gives you information about how to care for yourself after your procedure. Your health care provider may also  give you more specific instructions. If you have problems or questions, contact your health care provider. °What can I expect after the procedure? °After the procedure, it is common to have: °· Itching. °· Discomfort. °· Fluid discharge. °· Sensitivity to light and to touch. °· Bruising in or around the eye. °· Mild blurred vision. °Follow these instructions at home: °Eye care ° °· Do not touch or rub your eyes. °· Protect your eyes as told by your health care provider. You may be told to wear a protective eye shield or sunglasses. °· Do not put a contact lens into the affected eye or eyes until your health care provider approves. °· Keep the area around your eye clean and dry: °? Avoid swimming. °? Do not allow water to hit you directly in the face while showering. °? Keep soap and shampoo out of your eyes. °· Check your eye every day for signs of infection. Watch for: °? Redness, swelling, or pain. °? Fluid, blood, or pus. °? Warmth. °? A bad smell. °? Vision that is getting worse. °? Sensitivity that is getting worse. °Activity °· Do not drive for 24 hours if you were given a sedative during your procedure. °· Avoid strenuous activities, such as playing contact sports, for as long as told by your health care provider. °· Do not drive or use heavy machinery until your health care provider approves. °· Do not bend or lift heavy objects. Bending increases pressure in the eye. You can walk, climb stairs, and do light household chores. °· Ask your health care provider when you can return to work. If you work in a dusty environment, you may be advised to wear protective eyewear for a period of time. °General instructions °· Take or apply over-the-counter and prescription medicines only as told by your health care provider. This includes eye drops. °· Keep all follow-up visits as told by your health care provider. This is important. °Contact a health care provider if: °· You have increased bruising around your  eye. °· You have pain that is not helped with medicine. °· You have a fever. °· You have redness, swelling, or pain in your eye. °· You have fluid, blood, or pus coming from your incision. °· Your vision gets worse. °· Your sensitivity to light gets worse. °Get help right away if: °· You have sudden loss of vision. °· You see flashes of light or spots (floaters). °· You have severe eye pain. °· You develop nausea or vomiting. °Summary °· After your procedure, it is common to have itching, discomfort, bruising, fluid discharge, or sensitivity to light. °· Follow instructions from your health care provider about caring for your eye after the procedure. °· Do not rub your eye after the procedure. You may need to wear eye protection or sunglasses. Do not wear contact lenses. Keep the area around your eye clean and dry. °· Avoid activities that require a lot of effort. These include playing sports and lifting heavy objects. °· Contact a health care provider if you have increased bruising, pain that does not go away, or a fever. Get   help right away if you suddenly lose your vision, see flashes of light or spots, or have severe pain in the eye. °This information is not intended to replace advice given to you by your health care provider. Make sure you discuss any questions you have with your health care provider. °Document Released: 11/11/2004 Document Revised: 10/22/2017 Document Reviewed: 10/22/2017 °Elsevier Patient Education © 2020 Else[employer] Inc. ° °

## 2019-02-13 NOTE — Anesthesia Postprocedure Evaluation (Signed)
Anesthesia Post Note  Patient: Briana Cook  Procedure(s) Performed: CATARACT EXTRACTION PHACO AND INTRAOCULAR LENS PLACEMENT (IOC) LEFT VISION BLUE (Left )  Patient location during evaluation: PACU Anesthesia Type: MAC Level of consciousness: awake and alert and oriented Pain management: pain level controlled Vital Signs Assessment: post-procedure vital signs reviewed and stable Respiratory status: spontaneous breathing Cardiovascular status: blood pressure returned to baseline Postop Assessment: no headache Anesthetic complications: no     Last Vitals:  Vitals:   02/13/19 1207 02/13/19 1219  BP: (!) 186/59 (!) 158/56  Pulse: (!) 49 (!) 48  Resp: 16   Temp: (!) 36.3 C   SpO2: 96% 98%    Last Pain:  Vitals:   02/13/19 1207  TempSrc: Temporal  PainSc: 0-No pain                 Gitel Beste

## 2019-02-13 NOTE — Transfer of Care (Signed)
Immediate Anesthesia Transfer of Care Note  Patient: Briana Cook  Procedure(s) Performed: CATARACT EXTRACTION PHACO AND INTRAOCULAR LENS PLACEMENT (IOC) LEFT VISION BLUE (Left )  Patient Location: PACU  Anesthesia Type:MAC  Level of Consciousness: awake, alert  and oriented  Airway & Oxygen Therapy: Patient Spontanous Breathing  Post-op Assessment: Report given to RN and Post -op Vital signs reviewed and stable  Post vital signs: Reviewed and stable  Last Vitals:  Vitals Value Taken Time  BP    Temp    Pulse    Resp    SpO2      Last Pain:  Vitals:   02/13/19 1023  TempSrc: Tympanic  PainSc: 9          Complications: No apparent anesthesia complications

## 2019-02-13 NOTE — Anesthesia Post-op Follow-up Note (Signed)
Anesthesia QCDR form completed.        

## 2019-02-14 ENCOUNTER — Encounter: Payer: Self-pay | Admitting: Ophthalmology

## 2019-02-22 ENCOUNTER — Encounter: Payer: Self-pay | Admitting: Ophthalmology

## 2020-02-24 ENCOUNTER — Other Ambulatory Visit: Payer: Self-pay

## 2020-02-24 ENCOUNTER — Emergency Department
Admission: EM | Admit: 2020-02-24 | Discharge: 2020-02-24 | Disposition: A | Discharge status: Home / Self Care | Payer: Medicare Other | Attending: Emergency Medicine | Admitting: Emergency Medicine

## 2020-02-24 ENCOUNTER — Encounter (INDEPENDENT_AMBULATORY_CARE_PROVIDER_SITE_OTHER): Payer: Self-pay

## 2020-02-24 DIAGNOSIS — E860 Dehydration: Secondary | ICD-10-CM

## 2020-02-24 DIAGNOSIS — Z79899 Other long term (current) drug therapy: Secondary | ICD-10-CM | POA: Diagnosis not present

## 2020-02-24 DIAGNOSIS — Z96651 Presence of right artificial knee joint: Secondary | ICD-10-CM | POA: Diagnosis not present

## 2020-02-24 DIAGNOSIS — N179 Acute kidney failure, unspecified: Secondary | ICD-10-CM | POA: Insufficient documentation

## 2020-02-24 DIAGNOSIS — Z7982 Long term (current) use of aspirin: Secondary | ICD-10-CM | POA: Insufficient documentation

## 2020-02-24 DIAGNOSIS — R42 Dizziness and giddiness: Secondary | ICD-10-CM | POA: Diagnosis not present

## 2020-02-24 DIAGNOSIS — Z96611 Presence of right artificial shoulder joint: Secondary | ICD-10-CM | POA: Insufficient documentation

## 2020-02-24 DIAGNOSIS — E119 Type 2 diabetes mellitus without complications: Secondary | ICD-10-CM | POA: Insufficient documentation

## 2020-02-24 DIAGNOSIS — I10 Essential (primary) hypertension: Secondary | ICD-10-CM | POA: Diagnosis not present

## 2020-02-24 DIAGNOSIS — N390 Urinary tract infection, site not specified: Secondary | ICD-10-CM | POA: Insufficient documentation

## 2020-02-24 LAB — URINALYSIS, COMPLETE (UACMP) WITH MICROSCOPIC
Bilirubin Urine: NEGATIVE
Glucose, UA: NEGATIVE mg/dL
Hgb urine dipstick: NEGATIVE
Ketones, ur: NEGATIVE mg/dL
Leukocytes,Ua: NEGATIVE
Nitrite: POSITIVE — AB
Protein, ur: 30 mg/dL — AB
Specific Gravity, Urine: 1.02 (ref 1.005–1.030)
pH: 5 (ref 5.0–8.0)

## 2020-02-24 LAB — BASIC METABOLIC PANEL
Anion gap: 13 (ref 5–15)
BUN: 25 mg/dL — ABNORMAL HIGH (ref 8–23)
CO2: 22 mmol/L (ref 22–32)
Calcium: 9.6 mg/dL (ref 8.9–10.3)
Chloride: 106 mmol/L (ref 98–111)
Creatinine, Ser: 1.47 mg/dL — ABNORMAL HIGH (ref 0.44–1.00)
GFR, Estimated: 33 mL/min — ABNORMAL LOW (ref >60)
Glucose, Bld: 122 mg/dL — ABNORMAL HIGH (ref 70–99)
Potassium: 3.9 mmol/L (ref 3.5–5.1)
Sodium: 141 mmol/L (ref 135–145)

## 2020-02-24 LAB — CBC
HCT: 38.6 % (ref 36.0–46.0)
Hemoglobin: 12.1 g/dL (ref 12.0–15.0)
MCH: 26.7 pg (ref 26.0–34.0)
MCHC: 31.3 g/dL (ref 30.0–36.0)
MCV: 85 fL (ref 80.0–100.0)
Platelets: 346 10*3/uL (ref 150–400)
RBC: 4.54 MIL/uL (ref 3.87–5.11)
RDW: 16.4 % — ABNORMAL HIGH (ref 11.5–15.5)
WBC: 11.4 10*3/uL — ABNORMAL HIGH (ref 4.0–10.5)
nRBC: 0 % (ref 0.0–0.2)

## 2020-02-24 LAB — TROPONIN I (HIGH SENSITIVITY)
Troponin I (High Sensitivity): 17 ng/L (ref <18)
Troponin I (High Sensitivity): 15 ng/L (ref <18)

## 2020-02-24 MED ORDER — SODIUM CHLORIDE 0.9 % IV BOLUS
500.0000 mL | Freq: Once | INTRAVENOUS | Status: AC
  Administered 2020-02-24: 500 mL via INTRAVENOUS

## 2020-02-24 MED ORDER — SODIUM CHLORIDE 0.9 % IV SOLN
1.0000 g | Freq: Once | INTRAVENOUS | Status: AC
  Administered 2020-02-24: 1 g via INTRAVENOUS
  Filled 2020-02-24: qty 10

## 2020-02-24 MED ORDER — CEPHALEXIN 500 MG PO CAPS: 500.0000 mg | ORAL_CAPSULE | Freq: Two times a day (BID) | ORAL | 0 refills | Status: AC

## 2020-02-24 MED ORDER — ONDANSETRON 4 MG PO TBDP
4.0000 mg | ORAL_TABLET | Freq: Four times a day (QID) | ORAL | 0 refills | Status: DC | PRN
Start: 2020-02-24 — End: 2020-12-14

## 2020-02-24 NOTE — ED Provider Notes (Signed)
[facility] Provider Note   ____________________________________________   First MD Initiated Contact with Patient 02/24/20 1032     (approximate)  I have reviewed the triage vital signs and the nursing notes.   HISTORY  Chief Complaint Dizziness    HPI Char Feltman is a 85 y.o. female suspension for about 2 weeks now been having a feeling of lightheadedness.  Associated with this she is also had a burning feeling with urination.  Family reports she just continues to feel a little bit more fatigued than normal, generally fatigued weak, uses a walker all the time for about the last year, and she has been having some abnormal odor as well with urination.  Similar symptoms in the past with urinary tract infections  Daughter reports no significant change in her mental status.  She is not eating and drinking much.  They went to urgent care today and were referred here because of their concerns for dehydration   Past Medical History:  Diagnosis Date   Arthritis    Cough    Depression    Diabetes (HCC)    Environmental and seasonal allergies    GERD (gastroesophageal reflux disease)    HLD (hyperlipidemia)    HOH (hard of hearing)    Hypertension    Lower extremity edema    Osteoporosis    Palpitations    Sinus congestion    Wheezing     Patient Active Problem List   Diagnosis Date Noted   CAP (community acquired pneumonia) 04/06/2018   HTN (hypertension) 04/06/2018   Diabetes (HCC) 04/06/2018   HLD (hyperlipidemia) 04/06/2018   GERD (gastroesophageal reflux disease) 04/06/2018   Viral enteritis 03/01/2016   Hypotension 03/01/2016   Acute renal failure (ARF) (HCC) 03/01/2016   Acidosis 03/01/2016   UTI (urinary tract infection) 03/01/2016   Viral gastroenteritis 03/01/2016    Past Surgical History:  Procedure Laterality Date   BREAST BIOPSY     x2   CATARACT EXTRACTION W/PHACO Right  01/23/2019   Procedure: CATARACT EXTRACTION PHACO AND INTRAOCULAR LENS PLACEMENT (IOC) RIGHT, MALYUGIN;  Surgeon: Elliot Cousin, MD;  Location: [facility] ORS;  Service: Ophthalmology;  Laterality: Right;  [place]  01:33 CDE 13.28 Fluid pack lot # [id number]   CATARACT EXTRACTION W/PHACO Left 02/13/2019   Procedure: CATARACT EXTRACTION PHACO AND INTRAOCULAR LENS PLACEMENT (IOC) LEFT VISION BLUE;  Surgeon: Elliot Cousin, MD;  Location: [facility] ORS;  Service: Ophthalmology;  Laterality: Left;  Lot # [id number] H [place]: 01:10.5 CDE: 11.53   CHOLECYSTECTOMY     REPLACEMENT TOTAL KNEE Right    TOTAL SHOULDER REPLACEMENT Right     Prior to Admission medications   Medication Sig Start Date End Date Taking? Authorizing Provider  albuterol (PROVENTIL HFA;VENTOLIN HFA) 108 (90 Base) MCG/ACT inhaler Inhale 2 puffs into the lungs every 6 (six) hours as needed for wheezing or shortness of breath. Patient not taking: Reported on 02/12/2019 04/08/18   Milagros Loll, MD  amLODipine (NORVASC) 5 MG tablet Take 5 mg by mouth daily. 03/10/18   [provider]  aspirin EC 81 MG tablet Take 81 mg by mouth daily.    [provider]  Calcium Carbonate-Vitamin D (CALCIUM 600+D PO) Take 2 tablets by mouth daily.    [provider]  cephALEXin (KEFLEX) 500 MG capsule Take 1 capsule (500 mg total) by mouth 2 (two) times daily for 7 days. 02/24/20 03/02/20  Sharyn Creamer, MD  DULoxetine (CYMBALTA) 60 MG capsule Take 60 mg by  mouth daily. 03/10/18   [provider]  Menthol (ICY HOT) 5 % PTCH Apply 1 patch topically daily as needed (pain).    [provider]  Menthol, Topical Analgesic, (ICY HOT EX) Apply 1 application topically daily as needed (pain).    [provider]  metoprolol succinate (TOPROL-XL) 25 MG 24 hr tablet Take 25 mg by mouth 2 (two) times daily.  03/10/18   [provider]  omeprazole (PRILOSEC) 20 MG capsule Take 20 mg by mouth daily.  02/20/16   [provider]  ondansetron (ZOFRAN ODT) 4 MG disintegrating tablet Take 1 tablet (4 mg total) by mouth every 6 (six) hours as needed for nausea or vomiting. 02/24/20   Sharyn Creamer, MD  simvastatin (ZOCOR) 40 MG tablet Take 40 mg by mouth daily.  02/20/16   [provider]  traMADol (ULTRAM) 50 MG tablet Take 50-100 mg by mouth every 8 (eight) hours as needed for moderate pain.  01/26/16   [provider]  vitamin B-12 (CYANOCOBALAMIN) 1000 MCG tablet Take 1,000 mcg by mouth daily.    [provider]    Allergies Oxycodone  Family History  Problem Relation Age of Onset   Hypertension Mother    Cancer Father    Cancer Sister    Cancer Brother     Social History Social History   Tobacco Use   Smoking status: Never Smoker   Smokeless tobacco: Never Used  Substance Use Topics   Alcohol use: Yes    Comment: occasionally   Drug use: No    Review of Systems Constitutional: No fever/chills Eyes: No visual changes. ENT: No sore throat. Cardiovascular: Denies chest pain. Respiratory: Denies shortness of breath. Gastrointestinal: No abdominal pain.   Genitourinary: Dysuria as well as abnormal urine odor Musculoskeletal: Negative for back pain.  Chronic arthritis Skin: Negative for rash. Neurological: Negative for headaches, areas of focal weakness or numbness.    ____________________________________________   PHYSICAL EXAM:  VITAL SIGNS: ED Triage Vitals  Enc Vitals Group     BP 02/24/20 0958 (!) 141/58     Pulse Rate 02/24/20 0958 61     Resp 02/24/20 0958 17     Temp 02/24/20 0958 98.9 F (37.2 C)     Temp Source 02/24/20 0958 Oral     SpO2 02/24/20 0958 96 %     Weight 02/24/20 0959 200 lb (90.7 kg)     Height 02/24/20 0959 5\' 4"  (1.626 m)     Head Circumference --      Peak Flow --      Pain Score 02/24/20 0959 0     Pain Loc --      Pain Edu? --      Excl. in GC? --     Constitutional: Alert and oriented. Well appearing  and in no acute distress.  She appears somewhat fatigued, and slightly chronically ill but in no distress Eyes: Conjunctivae are normal. Head: Atraumatic. Nose: No congestion/rhinnorhea.  Hard of hearing Mouth/Throat: Mucous membranes are slightly dry. Neck: No stridor.  Cardiovascular: Normal rate, regular rhythm. Grossly normal heart sounds.  Good peripheral circulation. Respiratory: Normal respiratory effort.  No retractions. Lungs CTAB. Gastrointestinal: Soft and nontender. No distention.  No suprapubic tenderness. Musculoskeletal: No lower extremity tenderness nor edema.  5 out of 5 strength in all extremities limitation of range of motion at both shoulders due to previous disease.  Reports baseline strength in all extremities.  Normal grip strength. Neurologic:  Normal  speech and language. No gross focal neurologic deficits are appreciated.  No facial droop. Skin:  Skin is warm, dry and intact. No rash noted. Psychiatric: Mood and affect are normal. Speech and behavior are normal.  ____________________________________________   LABS (all labs ordered are listed, but only abnormal results are displayed)  Labs Reviewed  BASIC METABOLIC PANEL - Abnormal; Notable for the following components:      Result Value   Glucose, Bld 122 (*)    BUN 25 (*)    Creatinine, Ser 1.47 (*)    GFR, Estimated 33 (*)    All other components within normal limits  CBC - Abnormal; Notable for the following components:   WBC 11.4 (*)    RDW 16.4 (*)    All other components within normal limits  URINALYSIS, COMPLETE (UACMP) WITH MICROSCOPIC - Abnormal; Notable for the following components:   Color, Urine AMBER (*)    APPearance CLEAR (*)    Protein, ur 30 (*)    Nitrite POSITIVE (*)    Bacteria, UA MANY (*)    All other components within normal limits  CBG MONITORING, ED  TROPONIN I (HIGH SENSITIVITY)  TROPONIN I (HIGH SENSITIVITY)   ____________________________________________  EKG  Reviewed  interpreted at 1033 Heart rate 60 QRS 90 QTc 420 Normal sinus rhythm, ST segment inversions noted in inferolateral distribution with slight depression.  Favored to be potentially a repolarization type abnormality.  Patient does not have any active chest pain or cardiac or pulmonary symptoms.  I am unable to view prior EKGs.  Does appear that to have been performed in the past but neither are available for viewing.  I will add on a troponin reassuring on repeat and initial testing ____________________________________________  RADIOLOGY  No results found.  No noted cardiac or pulmonary symptoms. No hypoxia. Normal work of breathing and clear lung sounds ____________________________________________   PROCEDURES  Procedure(s) performed: None  Procedures  Critical Care performed: No  ____________________________________________   INITIAL IMPRESSION / ASSESSMENT AND PLAN / ED COURSE  Pertinent labs & imaging results that were available during my care of the patient were reviewed by me and considered in my medical decision making (see chart for details).   Generalized weakness, increased fatigue, dysuria and abnormal urine odor.  Also noted to have orthostatic hypotension at urgent care referred to the ER for further evaluation.  She does appear dehydrated, likely secondary to prerenal etiology and high suspicion for possible recurrent UTI.  No focal neurologic symptoms.  No cardiac or pulmonary symptoms though EKG is abnormal, without chest pain or cardiac symptoms her troponin is reassuring  Clinical Course as of Feb 23 1441  Tue Feb 24, 2020  1245 Patient up, able to ambulate, feeling improved.  No distress.  Troponin reassuring.   [MQ]    Clinical Course User Index [MQ] Sharyn CreamerQuale, Judyann Casasola, MD   Labs demonstrated a GFR of 33 demonstrating AKI.  Creatinine is modestly elevated.  This is likely prerenal in nature given the clinical  history.   ----------------------------------------- 11:21 AM on 02/24/2020 -----------------------------------------  Discussed concerns for urinary tract infection and dehydration with the patient and her daughter.  Discussed options including treatment with antibiotics and if she is doing well at home potentially continuing care at home after receiving fluids and hydration here with plan for close follow-up.  Discussed with the patient and offered admission versus home care, after weighing risks benefits and having discussion and shared medical decision making with patient and her daughter  patient would like to trial home after fluids and antibiotics with careful return precautions which I think is reasonable.  ----------------------------------------- 2:37 PM on 02/24/2020 ----------------------------------------- Patient is up ambulating well. Reports she feels well and comfortable with plan for discharge. Discussed antibiotics, careful return precautions with patient and her daughter both in agreement. Patient appears well, ambulatory improved and appears appropriate for trial of outpatient treatment for urinary tract infection and dehydration  ____________________________________________   FINAL CLINICAL IMPRESSION(S) / ED DIAGNOSES  Final diagnoses:  Dizziness  Dehydration  AKI (acute kidney injury) (HCC)  Urinary tract infection, acute        Note:  This document was prepared using Dragon voice recognition software and may include unintentional dictation errors       Sharyn Creamer, MD 02/24/20 1442

## 2020-02-24 NOTE — ED Triage Notes (Signed)
Pt c/o feeling dizzy for the past 2 weeks. Recent treatment for a UTi. Denies any pain or other sx.

## 2020-12-12 ENCOUNTER — Emergency Department: Payer: Medicare Other

## 2020-12-12 ENCOUNTER — Other Ambulatory Visit: Payer: Self-pay

## 2020-12-12 ENCOUNTER — Inpatient Hospital Stay
Admission: EM | Admit: 2020-12-12 | Discharge: 2020-12-14 | DRG: 177 | Disposition: A | Discharge status: Home / Self Care | Payer: Medicare Other | Attending: Internal Medicine | Admitting: Internal Medicine

## 2020-12-12 DIAGNOSIS — H9190 Unspecified hearing loss, unspecified ear: Secondary | ICD-10-CM | POA: Diagnosis not present

## 2020-12-12 DIAGNOSIS — Z8701 Personal history of pneumonia (recurrent): Secondary | ICD-10-CM | POA: Diagnosis not present

## 2020-12-12 DIAGNOSIS — Z87440 Personal history of urinary (tract) infections: Secondary | ICD-10-CM

## 2020-12-12 DIAGNOSIS — M810 Age-related osteoporosis without current pathological fracture: Secondary | ICD-10-CM | POA: Diagnosis present

## 2020-12-12 DIAGNOSIS — Z8249 Family history of ischemic heart disease and other diseases of the circulatory system: Secondary | ICD-10-CM | POA: Diagnosis not present

## 2020-12-12 DIAGNOSIS — I129 Hypertensive chronic kidney disease with stage 1 through stage 4 chronic kidney disease, or unspecified chronic kidney disease: Secondary | ICD-10-CM | POA: Diagnosis not present

## 2020-12-12 DIAGNOSIS — E669 Obesity, unspecified: Secondary | ICD-10-CM | POA: Diagnosis present

## 2020-12-12 DIAGNOSIS — N1831 Chronic kidney disease, stage 3a: Secondary | ICD-10-CM | POA: Diagnosis not present

## 2020-12-12 DIAGNOSIS — U071 COVID-19: Principal | ICD-10-CM | POA: Diagnosis present

## 2020-12-12 DIAGNOSIS — J9601 Acute respiratory failure with hypoxia: Secondary | ICD-10-CM | POA: Diagnosis present

## 2020-12-12 DIAGNOSIS — E785 Hyperlipidemia, unspecified: Secondary | ICD-10-CM | POA: Diagnosis not present

## 2020-12-12 DIAGNOSIS — F32A Depression, unspecified: Secondary | ICD-10-CM | POA: Diagnosis not present

## 2020-12-12 DIAGNOSIS — Z6841 Body Mass Index (BMI) 40.0 and over, adult: Secondary | ICD-10-CM

## 2020-12-12 DIAGNOSIS — R339 Retention of urine, unspecified: Secondary | ICD-10-CM | POA: Diagnosis not present

## 2020-12-12 DIAGNOSIS — Z79899 Other long term (current) drug therapy: Secondary | ICD-10-CM | POA: Diagnosis not present

## 2020-12-12 DIAGNOSIS — E1122 Type 2 diabetes mellitus with diabetic chronic kidney disease: Secondary | ICD-10-CM | POA: Diagnosis present

## 2020-12-12 DIAGNOSIS — N183 Chronic kidney disease, stage 3 unspecified: Secondary | ICD-10-CM

## 2020-12-12 DIAGNOSIS — Z885 Allergy status to narcotic agent status: Secondary | ICD-10-CM

## 2020-12-12 DIAGNOSIS — Z66 Do not resuscitate: Secondary | ICD-10-CM | POA: Diagnosis present

## 2020-12-12 DIAGNOSIS — I480 Paroxysmal atrial fibrillation: Secondary | ICD-10-CM | POA: Diagnosis present

## 2020-12-12 DIAGNOSIS — Z7982 Long term (current) use of aspirin: Secondary | ICD-10-CM | POA: Diagnosis not present

## 2020-12-12 DIAGNOSIS — K219 Gastro-esophageal reflux disease without esophagitis: Secondary | ICD-10-CM | POA: Diagnosis not present

## 2020-12-12 DIAGNOSIS — I10 Essential (primary) hypertension: Secondary | ICD-10-CM | POA: Diagnosis present

## 2020-12-12 DIAGNOSIS — M1990 Unspecified osteoarthritis, unspecified site: Secondary | ICD-10-CM | POA: Diagnosis not present

## 2020-12-12 DIAGNOSIS — I4891 Unspecified atrial fibrillation: Secondary | ICD-10-CM | POA: Diagnosis present

## 2020-12-12 LAB — COMPREHENSIVE METABOLIC PANEL
ALT: 12 U/L (ref 0–44)
AST: 15 U/L (ref 15–41)
Albumin: 3.3 g/dL — ABNORMAL LOW (ref 3.5–5.0)
Alkaline Phosphatase: 41 U/L (ref 38–126)
Anion gap: 9 (ref 5–15)
BUN: 22 mg/dL (ref 8–23)
CO2: 23 mmol/L (ref 22–32)
Calcium: 8.8 mg/dL — ABNORMAL LOW (ref 8.9–10.3)
Chloride: 108 mmol/L (ref 98–111)
Creatinine, Ser: 1.16 mg/dL — ABNORMAL HIGH (ref 0.44–1.00)
GFR, Estimated: 46 mL/min — ABNORMAL LOW (ref >60)
Glucose, Bld: 116 mg/dL — ABNORMAL HIGH (ref 70–99)
Potassium: 4 mmol/L (ref 3.5–5.1)
Sodium: 140 mmol/L (ref 135–145)
Total Bilirubin: 1.4 mg/dL — ABNORMAL HIGH (ref 0.3–1.2)
Total Protein: 6.1 g/dL — ABNORMAL LOW (ref 6.5–8.1)

## 2020-12-12 LAB — URINALYSIS, COMPLETE (UACMP) WITH MICROSCOPIC
Bilirubin Urine: NEGATIVE
Glucose, UA: NEGATIVE mg/dL
Hgb urine dipstick: NEGATIVE
Ketones, ur: NEGATIVE mg/dL
Nitrite: NEGATIVE
Protein, ur: NEGATIVE mg/dL
Specific Gravity, Urine: 1.03 (ref 1.005–1.030)
pH: 5 (ref 5.0–8.0)

## 2020-12-12 LAB — CBC WITH DIFFERENTIAL/PLATELET
Abs Immature Granulocytes: 0.04 10*3/uL (ref 0.00–0.07)
Basophils Absolute: 0 10*3/uL (ref 0.0–0.1)
Basophils Relative: 0 %
Eosinophils Absolute: 0.1 10*3/uL (ref 0.0–0.5)
Eosinophils Relative: 1 %
HCT: 35 % — ABNORMAL LOW (ref 36.0–46.0)
Hemoglobin: 11 g/dL — ABNORMAL LOW (ref 12.0–15.0)
Immature Granulocytes: 1 %
Lymphocytes Relative: 14 %
Lymphs Abs: 1.1 10*3/uL (ref 0.7–4.0)
MCH: 27.7 pg (ref 26.0–34.0)
MCHC: 31.4 g/dL (ref 30.0–36.0)
MCV: 88.2 fL (ref 80.0–100.0)
Monocytes Absolute: 0.4 10*3/uL (ref 0.1–1.0)
Monocytes Relative: 5 %
Neutro Abs: 6.3 10*3/uL (ref 1.7–7.7)
Neutrophils Relative %: 79 %
Platelets: 403 10*3/uL — ABNORMAL HIGH (ref 150–400)
RBC: 3.97 MIL/uL (ref 3.87–5.11)
RDW: 18.4 % — ABNORMAL HIGH (ref 11.5–15.5)
WBC: 7.9 10*3/uL (ref 4.0–10.5)
nRBC: 0 % (ref 0.0–0.2)

## 2020-12-12 LAB — TROPONIN I (HIGH SENSITIVITY)
Troponin I (High Sensitivity): 18 ng/L — ABNORMAL HIGH (ref <18)
Troponin I (High Sensitivity): 18 ng/L — ABNORMAL HIGH (ref <18)

## 2020-12-12 LAB — RESP PANEL BY RT-PCR (FLU A&B, COVID) ARPGX2
Influenza A by PCR: NEGATIVE
Influenza B by PCR: NEGATIVE
SARS Coronavirus 2 by RT PCR: POSITIVE — AB

## 2020-12-12 LAB — TSH: TSH: 0.983 u[IU]/mL (ref 0.350–4.500)

## 2020-12-12 MED ORDER — ACETAMINOPHEN 325 MG PO TABS: 650.0000 mg | ORAL_TABLET | ORAL | Status: DC | PRN

## 2020-12-12 MED ORDER — SODIUM CHLORIDE 0.9% FLUSH
3.0000 mL | Freq: Two times a day (BID) | INTRAVENOUS | Status: DC
  Administered 2020-12-12 – 2020-12-13 (×3): 3 mL via INTRAVENOUS

## 2020-12-12 MED ORDER — AMLODIPINE BESYLATE 5 MG PO TABS: 5.0000 mg | ORAL_TABLET | Freq: Every day | ORAL | Status: DC

## 2020-12-12 MED ORDER — VITAMIN B-12 1000 MCG PO TABS
1000.0000 ug | ORAL_TABLET | Freq: Every day | ORAL | Status: DC
  Administered 2020-12-12 – 2020-12-14 (×3): 1000 ug via ORAL
  Filled 2020-12-12 (×3): qty 1

## 2020-12-12 MED ORDER — SODIUM CHLORIDE 0.9 % IV SOLN: 250.0000 mL | INTRAVENOUS | Status: DC | PRN

## 2020-12-12 MED ORDER — ONDANSETRON HCL 4 MG/2ML IJ SOLN: 4.0000 mg | Freq: Four times a day (QID) | INTRAMUSCULAR | Status: DC | PRN

## 2020-12-12 MED ORDER — DILTIAZEM HCL-DEXTROSE 125-5 MG/125ML-% IV SOLN (PREMIX)
5.0000 mg/h | INTRAVENOUS | Status: DC
  Administered 2020-12-12: 5 mg/h via INTRAVENOUS
  Filled 2020-12-12: qty 125

## 2020-12-12 MED ORDER — DILTIAZEM HCL 60 MG PO TABS
60.0000 mg | ORAL_TABLET | Freq: Once | ORAL | Status: AC
  Administered 2020-12-12: 60 mg via ORAL
  Filled 2020-12-12: qty 1

## 2020-12-12 MED ORDER — CALCIUM CARBONATE-VITAMIN D 500-200 MG-UNIT PO TABS
1.0000 | ORAL_TABLET | Freq: Every day | ORAL | Status: DC
  Administered 2020-12-12 – 2020-12-14 (×3): 1 via ORAL
  Filled 2020-12-12 (×3): qty 1

## 2020-12-12 MED ORDER — SODIUM CHLORIDE 0.9% FLUSH: 3.0000 mL | INTRAVENOUS | Status: DC | PRN

## 2020-12-12 MED ORDER — APIXABAN 5 MG PO TABS
5.0000 mg | ORAL_TABLET | Freq: Two times a day (BID) | ORAL | Status: DC
  Administered 2020-12-12 – 2020-12-14 (×4): 5 mg via ORAL
  Filled 2020-12-12 (×4): qty 1

## 2020-12-12 MED ORDER — ATORVASTATIN CALCIUM 20 MG PO TABS
20.0000 mg | ORAL_TABLET | Freq: Every day | ORAL | Status: DC
  Administered 2020-12-12 – 2020-12-13 (×2): 20 mg via ORAL
  Filled 2020-12-12 (×2): qty 1

## 2020-12-12 MED ORDER — PANTOPRAZOLE SODIUM 40 MG PO TBEC
40.0000 mg | DELAYED_RELEASE_TABLET | Freq: Two times a day (BID) | ORAL | Status: DC
  Administered 2020-12-12 – 2020-12-14 (×4): 40 mg via ORAL
  Filled 2020-12-12 (×4): qty 1

## 2020-12-12 MED ORDER — LACTATED RINGERS IV BOLUS
1000.0000 mL | Freq: Once | INTRAVENOUS | Status: AC
  Administered 2020-12-12: 1000 mL via INTRAVENOUS

## 2020-12-12 MED ORDER — DILTIAZEM HCL ER COATED BEADS 120 MG PO CP24
240.0000 mg | ORAL_CAPSULE | Freq: Every day | ORAL | Status: DC
  Administered 2020-12-12 – 2020-12-14 (×3): 240 mg via ORAL
  Filled 2020-12-12: qty 2
  Filled 2020-12-12 (×2): qty 1

## 2020-12-12 MED ORDER — TRAMADOL HCL 50 MG PO TABS
50.0000 mg | ORAL_TABLET | Freq: Three times a day (TID) | ORAL | Status: DC | PRN
  Administered 2020-12-12 – 2020-12-13 (×2): 50 mg via ORAL
  Filled 2020-12-12 (×2): qty 1

## 2020-12-12 MED ORDER — DILTIAZEM HCL 25 MG/5ML IV SOLN
20.0000 mg | Freq: Once | INTRAVENOUS | Status: AC
  Administered 2020-12-12: 20 mg via INTRAVENOUS
  Filled 2020-12-12: qty 5

## 2020-12-12 MED ORDER — DULOXETINE HCL 30 MG PO CPEP
60.0000 mg | ORAL_CAPSULE | Freq: Every day | ORAL | Status: DC
  Administered 2020-12-12 – 2020-12-14 (×3): 60 mg via ORAL
  Filled 2020-12-12 (×2): qty 1
  Filled 2020-12-12: qty 2

## 2020-12-12 MED ORDER — SIMVASTATIN 10 MG PO TABS: 40.0000 mg | ORAL_TABLET | Freq: Every day | ORAL | Status: DC

## 2020-12-12 MED ORDER — METOPROLOL SUCCINATE ER 50 MG PO TB24
50.0000 mg | ORAL_TABLET | Freq: Two times a day (BID) | ORAL | Status: DC
  Administered 2020-12-12 – 2020-12-14 (×4): 50 mg via ORAL
  Filled 2020-12-12 (×4): qty 1

## 2020-12-12 NOTE — H&P (Signed)
History and Physical    [facility] NWG:[id number] DOB: 12/14/1934 DOA: 12/12/2020  PCP: Patient, No Pcp Per (Inactive)   Patient coming from: Home  I have personally briefly reviewed patient's old medical records in [facility]  Chief Complaint: Difficulty voiding  HPI: [facility] is a 86 y.o. female with medical history significant for diabetes mellitus with stage IIIa chronic kidney disease, hypertension, depression, GERD and dyslipidemia who presents to the ER via private vehicle for evaluation of worsening swelling in her hands and feet as well as difficulty voiding. Patient's daughter who provides most of the history states that patient does not drink enough fluids and they were concerned that she had not voided and so brought her to the ER. Patient denies having any abdominal pain but complains of nausea.  She denies having any changes in her bowel habits.  She denies having any dysuria or hematuria. She complains of worsening swelling in her feet and hands and also complains of shortness of breath with mild to moderate exertion.  She also complains of feeling dizzy and lightheaded but denies having any recent falls.  She has a cough that is nonproductive. She denies having any fever, no chills, no chest pain, no headache, no changes in her bowel habits, no palpitations, no headache, no focal deficits, no blurred vision. Labs show sodium 140, potassium 4.0, chloride 108, bicarb 23, glucose 116, BUN 22, creatinine 1.16, calcium 8.8, alkaline phosphatase 41, albumin 3.320 AST 15, ALT 12, total protein 6.1, total bilirubin 1.4, troponin 18, white count 7.9, hemoglobin 11.0, hematocrit 35.0, MCV 88.2, RDW 18.4, platelet count 403 Patient's SARS coronavirus 2 point-of-care test is positive. Chest x-ray reviewed by me shows postinflammatory scarring within the right lung.  No acute cardiopulmonary disease. Twelve-lead EKG reviewed by me shows atrial fibrillation with rapid  ventricular rate.  ST and T wave abnormality in the inferior lateral leads.   ED Course: Patient is an 86 year old female who presents to the emergency room for evaluation of difficulty voiding. She had a bladder scan done in the ER and only had 60 mL of urine in her bladder. Twelve-lead EKG was done to the ER showed rapid atrial fibrillation and patient does not have a known history of A. Fib. She received Cardizem IV push 20 mg x 1 as well as oral Cardizem 60 mg and initially was rate controlled but is now tachycardic. According to patient's daughter she had  COVID-19 viral illness and tested positive on 11/02/20.she did not require hospitalization and did not take any oral antiviral therapy as well.  Per daughter she had a negative home test a couple of weeks after. She is vaccinated but has not had any booster shots.  She will be referred to observation status for further evaluation.  Review of Systems: As per HPI otherwise all other systems reviewed and negative.    Past Medical History:  Diagnosis Date   Arthritis    Cough    Depression    Diabetes (HCC)    Environmental and seasonal allergies    GERD (gastroesophageal reflux disease)    HLD (hyperlipidemia)    HOH (hard of hearing)    Hypertension    Lower extremity edema    Osteoporosis    Palpitations    Sinus congestion    Wheezing     Past Surgical History:  Procedure Laterality Date   BREAST BIOPSY     x2   CATARACT EXTRACTION W/PHACO Right 01/23/2019   Procedure:  CATARACT EXTRACTION PHACO AND INTRAOCULAR LENS PLACEMENT (IOC) RIGHT, MALYUGIN;  Surgeon: [facility] CousinHarrow, Brian, MD;  Location: [facility] ORS;  Service: Ophthalmology;  Laterality: Right;  [place]  01:33 CDE 13.28 Fluid pack lot # [id number]   CATARACT EXTRACTION W/PHACO Left 02/13/2019   Procedure: CATARACT EXTRACTION PHACO AND INTRAOCULAR LENS PLACEMENT (IOC) LEFT VISION BLUE;  Surgeon: [facility] CousinHarrow, Brian, MD;  Location: [facility] ORS;  Service: Ophthalmology;  Laterality: Left;  Lot  # [id number] H US: 01:10.5 CDE: 11.53   CHOLECYSTECTOMY     REPLACEMENT TOTAL KNEE Right    TOTAL SHOULDER REPLACEMENT Right      reports that she has never smoked. She has never used smokeless tobacco. She reports current alcohol use. She reports that she does not use drugs.  Allergies  Allergen Reactions   Oxycodone Other (See Comments)    Pt reports "It makes me crazy"    Family History  Problem Relation Age of Onset   Hypertension Mother    Cancer Father    Cancer Sister    Cancer Brother       Prior to Admission medications   Medication Sig Start Date End Date Taking? Authorizing Provider  albuterol (PROVENTIL HFA;VENTOLIN HFA) 108 (90 Base) MCG/ACT inhaler Inhale 2 puffs into the lungs every 6 (six) hours as needed for wheezing or shortness of breath. Patient not taking: Reported on 02/12/2019 04/08/18   [facility] LollSudini, Srikar, MD  amLODipine (NORVASC) 5 MG tablet Take 5 mg by mouth daily. 03/10/18   [provider]  aspirin EC 81 MG tablet Take 81 mg by mouth daily.    [provider]  Calcium Carbonate-Vitamin D (CALCIUM 600+D PO) Take 2 tablets by mouth daily.    [provider]  DULoxetine (CYMBALTA) 60 MG capsule Take 60 mg by mouth daily. 03/10/18   [provider]  Menthol (ICY HOT) 5 % PTCH Apply 1 patch topically daily as needed (pain).    [provider]  Menthol, Topical Analgesic, (ICY HOT EX) Apply 1 application topically daily as needed (pain).    [provider]  metoprolol succinate (TOPROL-XL) 25 MG 24 hr tablet Take 25 mg by mouth 2 (two) times daily.  03/10/18   [provider]  omeprazole (PRILOSEC) 20 MG capsule Take 20 mg by mouth daily.  02/20/16   [provider]  ondansetron (ZOFRAN ODT) 4 MG disintegrating tablet Take 1 tablet (4 mg total) by mouth every 6 (six) hours as needed for nausea or vomiting. 02/24/20   [facility] CreamerQuale, Mark, MD  simvastatin (ZOCOR) 40 MG tablet Take 40 mg by mouth daily.   02/20/16   [provider]  traMADol (ULTRAM) 50 MG tablet Take 50-100 mg by mouth every 8 (eight) hours as needed for moderate pain.  01/26/16   [provider]  vitamin B-12 (CYANOCOBALAMIN) 1000 MCG tablet Take 1,000 mcg by mouth daily.    [provider]    Physical Exam: Vitals:   12/12/20 1430 12/12/20 1434 12/12/20 1500 12/12/20 1530  BP: (!) 142/112  131/77 128/83  Pulse: (!) 144 (!) 36 (!) 110 99  Resp: (!) 22  (!) 22 (!) 25  Temp:      TempSrc:      SpO2: 95% 95% 94% 93%  Weight:      Height:         Vitals:   12/12/20 1430 12/12/20 1434 12/12/20 1500 12/12/20 1530  BP: (!) 142/112  131/77 128/83  Pulse: (!) 144 (!) 36 (!) 110 99  Resp: (!) 22  (!) 22 (!) 25  Temp:      TempSrc:      SpO2: 95% 95% 94% 93%  Weight:      Height:          Constitutional: Alert and oriented x 3 . Not in any apparent distress HEENT:      Head: Normocephalic and atraumatic.         Eyes: PERLA, EOMI, Conjunctivae are normal. Sclera is non-icteric.       Mouth/Throat: Mucous membranes are moist.       Neck: Supple with no signs of meningismus. Cardiovascular: Irregularly irregular. No murmurs, gallops, or rubs. 2+ symmetrical distal pulses are present . No JVD. 2+ LE edema Respiratory: Respiratory effort normal .Lungs sounds clear bilaterally. No wheezes, crackles, or rhonchi.  Gastrointestinal: Soft, non tender, and non distended with positive bowel sounds.  Genitourinary: No CVA tenderness. Musculoskeletal: Nontender with normal range of motion in all extremities. No cyanosis, or erythema of extremities. Neurologic:  Face is symmetric. Moving all extremities. No gross focal neurologic deficits . Skin: Skin is warm, dry.  No rash or ulcers Psychiatric: Mood and affect are normal    Labs on Admission: I have personally reviewed following labs and imaging studies  CBC: Recent Labs  Lab 12/12/20 1145  WBC 7.9  NEUTROABS 6.3  HGB 11.0*  HCT 35.0*   MCV 88.2  PLT 403*   Basic Metabolic Panel: Recent Labs  Lab 12/12/20 1145  NA 140  K 4.0  CL 108  CO2 23  GLUCOSE 116*  BUN 22  CREATININE 1.16*  CALCIUM 8.8*   GFR: Estimated Creatinine Clearance: 39.4 mL/min (A) (by C-G formula based on SCr of 1.16 mg/dL (H)). Liver Function Tests: Recent Labs  Lab 12/12/20 1145  AST 15  ALT 12  ALKPHOS 41  BILITOT 1.4*  PROT 6.1*  ALBUMIN 3.3*   No results for input(s): LIPASE, AMYLASE in the last 168 hours. No results for input(s): AMMONIA in the last 168 hours. Coagulation Profile: No results for input(s): INR, PROTIME in the last 168 hours. Cardiac Enzymes: No results for input(s): CKTOTAL, CKMB, CKMBINDEX, TROPONINI in the last 168 hours. BNP (last 3 results) No results for input(s): PROBNP in the last 8760 hours. HbA1C: No results for input(s): HGBA1C in the last 72 hours. CBG: No results for input(s): GLUCAP in the last 168 hours. Lipid Profile: No results for input(s): CHOL, HDL, LDLCALC, TRIG, CHOLHDL, LDLDIRECT in the last 72 hours. Thyroid Function Tests: No results for input(s): TSH, T4TOTAL, FREET4, T3FREE, THYROIDAB in the last 72 hours. Anemia Panel: No results for input(s): VITAMINB12, FOLATE, FERRITIN, TIBC, IRON, RETICCTPCT in the last 72 hours. Urine analysis:    Component Value Date/Time   COLORURINE AMBER (A) 12/12/2020 1426   APPEARANCEUR HAZY (A) 12/12/2020 1426   LABSPEC 1.030 12/12/2020 1426   PHURINE 5.0 12/12/2020 1426   GLUCOSEU NEGATIVE 12/12/2020 1426   HGBUR NEGATIVE 12/12/2020 1426   BILIRUBINUR NEGATIVE 12/12/2020 1426   KETONESUR NEGATIVE 12/12/2020 1426   PROTEINUR NEGATIVE 12/12/2020 1426   NITRITE NEGATIVE 12/12/2020 1426   LEUKOCYTESUR SMALL (A) 12/12/2020 1426    Radiological Exams on Admission: DG Chest Portable 1 View  Result Date: 12/12/2020 CLINICAL DATA:  Dyspnea EXAM: PORTABLE CHEST 1 VIEW COMPARISON:  04/06/2018 FINDINGS: The lungs are well expanded. There are patchy  pulmonary infiltrates within the peripheral right mid and lower lung zone similar in appearance to prior examination possibly representing a small amount  of post inflammatory scarring given its stability over time. No definite superimposed acute pulmonary infiltrate. No pneumothorax. Small left pleural effusion is suspected. Mild cardiomegaly is stable. Pulmonary vascularity is normal. Right total shoulder arthroplasty has been performed. Aunts degenerative changes are seen within the left shoulder. IMPRESSION: Probable post inflammatory scarring within the right lung. No definite acute cardiopulmonary disease detected. Stable cardiomegaly. Electronically Signed   By: Helyn Numbers MD   On: 12/12/2020 13:41     Assessment/Plan Principal Problem:   Atrial fibrillation with RVR (HCC) Active Problems:   HTN (hypertension)   GERD (gastroesophageal reflux disease)   CKD stage 3 due to type 2 diabetes mellitus (HCC)   Depression   COVID-19 virus detected     Atrial fibrillation with rapid ventricular rate New onset Patient received a dose of IV Cardizem 20 mg x 1 dose and is currently on a Cardizem drip Titrate Cardizem drip for heart rate less than 100 Continue metoprolol Patient has a CHA2DS2-VASc score of 5 and ideally requires long-term anticoagulation as the primary prophylaxis for an acute stroke Will start patient on apixaban 5 mg p.o. twice daily Hold aspirin for now     Hypertension Continue metoprolol    Diabetes mellitus with stage III chronic kidney disease Renal function is stable Check blood sugars with meals    GERD Continue Protonix    COVID-19 virus detected (cycle threshold of 43) Patient does not have an active infection Patient tested positive for the COVID-19 virus on 11/02/20 She had a mild illness and did not require hospitalization She is vaccinated but has not received the booster shot She does not require isolation     Obesity (BMI  45) Complicates overall prognosis and care   DVT prophylaxis: Apixaban  Code Status: DNR  Family Communication: Greater than 50% of time was spent discussing patient's condition and plan of care with patient and her daughter at the bedside.  All questions and concerns have been addressed.  They verbalized understanding and agree with the plan.  CODE STATUS was discussed and she is a DNR. Disposition Plan: Back to previous home environment Consults called: Cardiology  Status:Observation    Lucile Shutters MD [facility]     12/12/2020, 4:30 PM

## 2020-12-12 NOTE — Progress Notes (Signed)
PHARMACIST - PHYSICIAN COMMUNICATION  CONCERNING:  Simvastatin dose with diltiazem   RECOMMENDATION: Patient was prescribed simvastatin 40 mg qHS (home regimen)  Dose of simvastatin should not exceed 10 mg daily if coadministering with diltiazem due to increased risk of rhabdomyolysis   DESCRIPTION: Pharmacy has substituted equivalent dose of atorvastatin    Patient is now receiving atorvastatin 20 mg qHS    Sharen Hones, PharmD, BCPS Clinical Pharmacist  12/12/2020 6:39 PM

## 2020-12-12 NOTE — ED Notes (Signed)
purewick was placed on pt at this time no urine has come out.  Also tegaderm and iv dressing was changed.

## 2020-12-12 NOTE — ED Notes (Signed)
Patient [facility] Cook is off; pt refused and yelled until the nurse got her up to use the restroom; pt urinated and had diarrhea

## 2020-12-12 NOTE — ED Notes (Signed)
At this time pt denies any chest pain but states she does feel like she has to urinate.

## 2020-12-12 NOTE — ED Notes (Signed)
Pt has no hx of afib and showing afib rvr on the ekg. Signed and brought back to room.

## 2020-12-12 NOTE — ED Notes (Signed)
Dr Agbata at bedside. 

## 2020-12-12 NOTE — ED Provider Notes (Addendum)
[facility]  ____________________________________________   Event Date/Time   First MD Initiated Contact with Patient 12/12/20 1116     (approximate)  I have reviewed the triage vital signs and the nursing notes.   HISTORY  Chief Complaint Urinary Retention    HPI Briana Cook is a 86 y.o. female arthritis, hypertension, diabetes, osteoporosis, chronic lower extremity edema who presents with difficulty urinating.  Patient notes that since last night she has not been able to pee.  She has some burning sensation.  She also notes some mild dyspnea but and is lightheaded and dizzy upon standing.  Denies chest pain or palpitations.  She has no prior history of atrial fibrillation is not anticoagulated.  She denies fevers or chills or back pain.  Patient's daughter notes that she has had UTIs multiple times in the past, and frequently takes Azo.        Past Medical History:  Diagnosis Date   Arthritis    Cough    Depression    Diabetes (HCC)    Environmental and seasonal allergies    GERD (gastroesophageal reflux disease)    HLD (hyperlipidemia)    HOH (hard of hearing)    Hypertension    Lower extremity edema    Osteoporosis    Palpitations    Sinus congestion    Wheezing     Patient Active Problem List   Diagnosis Date Noted   CAP (community acquired pneumonia) 04/06/2018   HTN (hypertension) 04/06/2018   Diabetes (HCC) 04/06/2018   HLD (hyperlipidemia) 04/06/2018   GERD (gastroesophageal reflux disease) 04/06/2018   Viral enteritis 03/01/2016   Hypotension 03/01/2016   Acute renal failure (ARF) (HCC) 03/01/2016   Acidosis 03/01/2016   UTI (urinary tract infection) 03/01/2016   Viral gastroenteritis 03/01/2016    Past Surgical History:  Procedure Laterality Date   BREAST BIOPSY     x2   CATARACT EXTRACTION W/PHACO Right 01/23/2019   Procedure: CATARACT EXTRACTION PHACO AND INTRAOCULAR LENS PLACEMENT (IOC) RIGHT, MALYUGIN;   Surgeon: Elliot CousinHarrow, Brian, MD;  Location: [facility] ORS;  Service: Ophthalmology;  Laterality: Right;  [place]  01:33 CDE 13.28 Fluid pack lot # [id number]   CATARACT EXTRACTION W/PHACO Left 02/13/2019   Procedure: CATARACT EXTRACTION PHACO AND INTRAOCULAR LENS PLACEMENT (IOC) LEFT VISION BLUE;  Surgeon: Elliot CousinHarrow, Brian, MD;  Location: [facility] ORS;  Service: Ophthalmology;  Laterality: Left;  Lot # [id number] H US: 01:10.5 CDE: 11.53   CHOLECYSTECTOMY     REPLACEMENT TOTAL KNEE Right    TOTAL SHOULDER REPLACEMENT Right     Prior to Admission medications   Medication Sig Start Date End Date Taking? Authorizing Provider  albuterol (PROVENTIL HFA;VENTOLIN HFA) 108 (90 Base) MCG/ACT inhaler Inhale 2 puffs into the lungs every 6 (six) hours as needed for wheezing or shortness of breath. Patient not taking: Reported on 02/12/2019 04/08/18   Milagros LollSudini, Srikar, MD  amLODipine (NORVASC) 5 MG tablet Take 5 mg by mouth daily. 03/10/18   [provider]  aspirin EC 81 MG tablet Take 81 mg by mouth daily.    [provider]  Calcium Carbonate-Vitamin D (CALCIUM 600+D PO) Take 2 tablets by mouth daily.    [provider]  DULoxetine (CYMBALTA) 60 MG capsule Take 60 mg by mouth daily. 03/10/18   [provider]  Menthol (ICY HOT) 5 % PTCH Apply 1 patch topically daily as needed (pain).    [provider]  Menthol, Topical Analgesic, (ICY HOT EX) Apply 1 application topically  daily as needed (pain).    [provider]  metoprolol succinate (TOPROL-XL) 25 MG 24 hr tablet Take 25 mg by mouth 2 (two) times daily.  03/10/18   [provider]  omeprazole (PRILOSEC) 20 MG capsule Take 20 mg by mouth daily.  02/20/16   [provider]  ondansetron (ZOFRAN ODT) 4 MG disintegrating tablet Take 1 tablet (4 mg total) by mouth every 6 (six) hours as needed for nausea or vomiting. 02/24/20   Sharyn Creamer, MD  simvastatin (ZOCOR) 40 MG tablet Take 40 mg by mouth daily.  02/20/16    [provider]  traMADol (ULTRAM) 50 MG tablet Take 50-100 mg by mouth every 8 (eight) hours as needed for moderate pain.  01/26/16   [provider]  vitamin B-12 (CYANOCOBALAMIN) 1000 MCG tablet Take 1,000 mcg by mouth daily.    [provider]    Allergies Oxycodone  Family History  Problem Relation Age of Onset   Hypertension Mother    Cancer Father    Cancer Sister    Cancer Brother     Social History Social History   Tobacco Use   Smoking status: Never   Smokeless tobacco: Never  Substance Use Topics   Alcohol use: Yes    Comment: occasionally   Drug use: No    Review of Systems   Review of Systems  Constitutional:  Negative for appetite change, fatigue and fever.  Respiratory:  Positive for shortness of breath.   Cardiovascular:  Negative for chest pain and palpitations.  Gastrointestinal:  Negative for abdominal pain, nausea and vomiting.  Genitourinary:  Positive for difficulty urinating and dysuria.  Psychiatric/Behavioral:  Negative for confusion.    Physical Exam Updated Vital Signs BP 131/77   Pulse (!) 110   Temp 98 F (36.7 C) (Oral)   Resp (!) 22   Ht 5\' 4"  (1.626 m)   Wt 90.7 kg   SpO2 94%   BMI 34.33 kg/m   Physical Exam Vitals and nursing note reviewed.  Constitutional:      General: She is not in acute distress.    Appearance: Normal appearance. She is not toxic-appearing.  HENT:     Head: Normocephalic and atraumatic.     Nose: Nose normal. No congestion.     Mouth/Throat:     Mouth: Mucous membranes are moist.  Eyes:     General: No scleral icterus.    Conjunctiva/sclera: Conjunctivae normal.  Cardiovascular:     Rate and Rhythm: Tachycardia present. Rhythm irregular.  Pulmonary:     Effort: Pulmonary effort is normal. No respiratory distress.     Breath sounds: Normal breath sounds. No wheezing.  Abdominal:     Palpations: Abdomen is soft.     Tenderness: There is no abdominal tenderness. There is  no guarding.  Musculoskeletal:        General: No swelling, tenderness, deformity or signs of injury. Normal range of motion.     Cervical back: Neck supple. No rigidity.  Skin:    General: Skin is warm and dry.     Coloration: Skin is not jaundiced.  Neurological:     General: No focal deficit present.     Mental Status: She is alert and oriented to person, place, and time.  Psychiatric:        Mood and Affect: Mood normal.        Behavior: Behavior normal.     LABS (all labs ordered are listed, but only abnormal  results are displayed)  Labs Reviewed  RESP PANEL BY RT-PCR (FLU A&B, COVID) ARPGX2 - Abnormal; Notable for the following components:      Result Value   SARS Coronavirus 2 by RT PCR POSITIVE (*)    All other components within normal limits  CBC WITH DIFFERENTIAL/PLATELET - Abnormal; Notable for the following components:   Hemoglobin 11.0 (*)    HCT 35.0 (*)    RDW 18.4 (*)    Platelets 403 (*)    All other components within normal limits  COMPREHENSIVE METABOLIC PANEL - Abnormal; Notable for the following components:   Glucose, Bld 116 (*)    Creatinine, Ser 1.16 (*)    Calcium 8.8 (*)    Total Protein 6.1 (*)    Albumin 3.3 (*)    Total Bilirubin 1.4 (*)    GFR, Estimated 46 (*)    All other components within normal limits  URINALYSIS, COMPLETE (UACMP) WITH MICROSCOPIC - Abnormal; Notable for the following components:   Color, Urine AMBER (*)    APPearance HAZY (*)    Leukocytes,Ua SMALL (*)    Bacteria, UA RARE (*)    All other components within normal limits  TROPONIN I (HIGH SENSITIVITY) - Abnormal; Notable for the following components:   Troponin I (High Sensitivity) 18 (*)    All other components within normal limits  TROPONIN I (HIGH SENSITIVITY) - Abnormal; Notable for the following components:   Troponin I (High Sensitivity) 18 (*)    All other components within normal limits   ____________________________________________  EKG  Atrial  fibrillation with rapid ventricular response, nonspecific ST changes ____________________________________________  RADIOLOGY Ky Barban, personally viewed and evaluated these images (plain radiographs) as part of my medical decision making, as well as reviewing the written report by the radiologist.  ED MD interpretation: Chest x-ray reviewed by me shows some haziness in the right lower lobe, read by radiology to likely be scarring    ____________________________________________   PROCEDURES  Procedure(s) performed (including Critical Care):  .Critical Care  Date/Time: 12/12/2020 3:59 PM Performed by: Georga Hacking, MD Authorized by: Georga Hacking, MD   Critical care provider statement:    Critical care time (minutes):  45   Critical care was necessary to treat or prevent imminent or life-threatening deterioration of the following conditions:  Circulatory failure   Critical care was time spent personally by me on the following activities:  Development of treatment plan with patient or surrogate, ordering and performing treatments and interventions, ordering and review of radiographic studies, ordering and review of laboratory studies, pulse oximetry, re-evaluation of patient's condition and review of old charts   I assumed direction of critical care for this patient from another provider in my specialty: no     Care discussed with: admitting provider     ____________________________________________   INITIAL IMPRESSION / ASSESSMENT AND PLAN / ED COURSE     The patient is an 86 year old female with no prior history of A. Fib, who presents with shortness of breath, lightheadedness and difficulty urinating.  Patient is in atrial fibrillation with RVR on arrival with rates in the 160s, normal blood pressure and she is well-appearing.  Patient given a dose of IV dealt with rates in the 80s followed by a dose of p.o. dilt with hopes to rate control for potential  discharge.  Patient's rates then back up to the 110s/120s.  She is notably COVID-positive, troponin is 18x2.  UA with rare bacteria and small  leuks, not really consistent with infection.  Given this is her first diagnosis of A. fib and she is not rate controlled at this time in the setting of being COVID-positive will admit for ongoing monitoring and treatment. Clinical Course as of 12/12/20 1558  Sun Dec 12, 2020  1406 IMPRESSION: Probable post inflammatory scarring within the right lung. No definite acute cardiopulmonary disease detected.   [KM]    Clinical Course User Index [KM] Georga Hacking, MD     ____________________________________________   FINAL CLINICAL IMPRESSION(S) / ED DIAGNOSES  Final diagnoses:  Atrial fibrillation with RVR (HCC)  COVID     ED Discharge Orders     None        Note:  This document was prepared using Dragon voice recognition software and may include unintentional dictation errors.    Georga Hacking, MD 12/12/20 1510    Georga Hacking, MD 12/12/20 [phone]

## 2020-12-12 NOTE — ED Notes (Signed)
Helped pt to bathroom. Pt had a loose bowel movement. Pt back in bed and hooked up to monitor. Call bell within reach. No other needs ar this time.

## 2020-12-12 NOTE — ED Notes (Signed)
Covid result positive reported to Dr. Sidney Ace

## 2020-12-12 NOTE — Consult Note (Signed)
[facility] Cardiology Consultation Note  Patient ID: Briana Cook, MRN: [id number], DOB/AGE: 10-27-1934 86 y.o. Admit date: 12/12/2020   Date of Consult: 12/12/2020 Primary Physician: Patient, No Pcp Per (Inactive) Primary Cardiologist: None  Chief Complaint:  Chief Complaint  Patient presents with  . Urinary Retention   Reason for Consult:  Atrial fibrillation  HPI: 86 y.o. female with known chronic kidney disease stage III diabetes hypertension hyperlipidemia who is coming in with bladder issues for which she has had some difficulty.  The patient was seen in the emergency room and found to have atrial fibrillation with rapid ventricular rate and irregular heartbeat.  The patient has not had any evidence of congestive heart failure or myocardial infarction or angina at this time.  Troponin is 18 glomerular filtration rate is 46 chest x-ray is normal.  The patient is tolerating rapid rate atrial fibrillation with no evidence of significant symptoms  Past Medical History:  Diagnosis Date  . Arthritis   . Cough   . Depression   . Diabetes (HCC)   . Environmental and seasonal allergies   . GERD (gastroesophageal reflux disease)   . HLD (hyperlipidemia)   . HOH (hard of hearing)   . Hypertension   . Lower extremity edema   . Osteoporosis   . Palpitations   . Sinus congestion   . Wheezing       Surgical History:  Past Surgical History:  Procedure Laterality Date  . BREAST BIOPSY     x2  . CATARACT EXTRACTION W/PHACO Right 01/23/2019   Procedure: CATARACT EXTRACTION PHACO AND INTRAOCULAR LENS PLACEMENT (IOC) RIGHT, MALYUGIN;  Surgeon: Elliot Cousin, MD;  Location: [facility] ORS;  Service: Ophthalmology;  Laterality: Right;  [place]  01:33 CDE 13.28 Fluid pack lot # [id number]  . CATARACT EXTRACTION W/PHACO Left 02/13/2019   Procedure: CATARACT EXTRACTION PHACO AND INTRAOCULAR LENS PLACEMENT (IOC) LEFT VISION BLUE;  Surgeon: Elliot Cousin, MD;  Location: [facility] ORS;  Service:  Ophthalmology;  Laterality: Left;  Lot # [id number] H [place]: 01:10.5 CDE: 11.53  . CHOLECYSTECTOMY    . REPLACEMENT TOTAL KNEE Right   . TOTAL SHOULDER REPLACEMENT Right      Home Meds: Prior to Admission medications   Medication Sig Start Date End Date Taking? Authorizing Provider  amLODipine (NORVASC) 5 MG tablet Take 5 mg by mouth daily. 03/10/18  Yes [provider]  aspirin EC 81 MG tablet Take 81 mg by mouth daily.   Yes [provider]  Calcium Carbonate-Vitamin D (CALCIUM 600+D PO) Take 2 tablets by mouth daily.   Yes [provider]  DULoxetine (CYMBALTA) 60 MG capsule Take 60 mg by mouth daily. 03/10/18  Yes [provider]  metoprolol succinate (TOPROL-XL) 50 MG 24 hr tablet Take 50 mg by mouth 2 (two) times daily. 12/08/20  Yes [provider]  pantoprazole (PROTONIX) 40 MG tablet Take 40 mg by mouth 2 (two) times daily before a meal. 11/26/20  Yes [provider]  simvastatin (ZOCOR) 40 MG tablet Take 40 mg by mouth daily.  02/20/16  Yes [provider]  traMADol (ULTRAM) 50 MG tablet Take 50-100 mg by mouth every 8 (eight) hours as needed for moderate pain.  01/26/16  Yes [provider]  vitamin B-12 (CYANOCOBALAMIN) 1000 MCG tablet Take 1,000 mcg by mouth daily.   Yes [provider]  acetaminophen (TYLENOL) 500 MG tablet Take 1,000 mg by mouth every 8 (eight) hours as needed.    [provider]  albuterol (PROVENTIL HFA;VENTOLIN HFA) 108 (90 Base) MCG/ACT inhaler Inhale 2 puffs into the lungs every 6 (six) hours as needed for wheezing or shortness of breath. Patient not taking: No sig reported 04/08/18   Milagros Loll, MD  cetirizine (ZYRTEC) 10 MG tablet Take 10 mg by mouth daily as needed.    [provider]  Menthol (ICY HOT) 5 % PTCH Apply 1 patch topically daily as needed (pain).    [provider]  Menthol, Topical Analgesic, (ICY HOT EX) Apply 1 application topically daily  as needed (pain).    [provider]  metoprolol succinate (TOPROL-XL) 25 MG 24 hr tablet Take 25 mg by mouth 2 (two) times daily.  Patient not taking: Reported on 12/12/2020 03/10/18   [provider]  omeprazole (PRILOSEC) 20 MG capsule Take 20 mg by mouth daily.  Patient not taking: Reported on 12/12/2020 02/20/16   [provider]  ondansetron (ZOFRAN ODT) 4 MG disintegrating tablet Take 1 tablet (4 mg total) by mouth every 6 (six) hours as needed for nausea or vomiting. Patient not taking: No sig reported 02/24/20   Sharyn Creamer, MD    Inpatient Medications:  . apixaban  5 mg Oral BID  . atorvastatin  20 mg Oral QHS  . calcium-vitamin D  1 tablet Oral Daily  . DULoxetine  60 mg Oral Daily  . metoprolol succinate  50 mg Oral BID  . pantoprazole  40 mg Oral BID  . sodium chloride flush  3 mL Intravenous Q12H  . vitamin B-12  1,000 mcg Oral Daily   . sodium chloride    . diltiazem (CARDIZEM) infusion 7 mg/hr (12/12/20 2005)    Allergies:  Allergies  Allergen Reactions  . Oxycodone Other (See Comments)    Pt reports "It makes me crazy" Other reaction(s): Hallucination    Social History   Socioeconomic History  . Marital status: Widowed    Spouse name: Not on file  . Number of children: Not on file  . Years of education: Not on file  . Highest education level: Not on file  Occupational History  . Not on file  Tobacco Use  . Smoking status: Never  . Smokeless tobacco: Never  Substance and Sexual Activity  . Alcohol use: Yes    Comment: occasionally  . Drug use: No  . Sexual activity: Not on file  Other Topics Concern  . Not on file  Social History Narrative  . Not on file   Social Determinants of Health   Financial Resource Strain: Not on file  Food Insecurity: Not on file  Transportation Needs: Not on file  Physical Activity: Not on file  Stress: Not on file  Social Connections: Not on file  Intimate Partner Violence: Not on file      Family History  Problem Relation Age of Onset  . Hypertension Mother   . Cancer Father   . Cancer Sister   . Cancer Brother      Review of Systems Positive for bladder issues Negative for: General:  chills, fever, night sweats or weight changes.  Cardiovascular: PND orthopnea syncope dizziness  Dermatological skin lesions rashes Respiratory: Cough congestion Urologic: Frequent urination urination at night and hematuria Abdominal: negative for nausea, vomiting, diarrhea, bright red blood per rectum, melena, or hematemesis Neurologic: negative for visual changes, and/or hearing changes  All other systems reviewed and are otherwise negative except as noted above.  Labs: No results for input(s): CKTOTAL, CKMB, TROPONINI in the last 72  hours. Lab Results  Component Value Date   WBC 7.9 12/12/2020   HGB 11.0 (L) 12/12/2020   HCT 35.0 (L) 12/12/2020   MCV 88.2 12/12/2020   PLT 403 (H) 12/12/2020    Recent Labs  Lab 12/12/20 1145  NA 140  K 4.0  CL 108  CO2 23  BUN 22  CREATININE 1.16*  CALCIUM 8.8*  PROT 6.1*  BILITOT 1.4*  ALKPHOS 41  ALT 12  AST 15  GLUCOSE 116*   No results found for: CHOL, HDL, LDLCALC, TRIG No results found for: DDIMER  Radiology/Studies:  DG Chest Portable 1 View  Result Date: 12/12/2020 CLINICAL DATA:  Dyspnea EXAM: PORTABLE CHEST 1 VIEW COMPARISON:  04/06/2018 FINDINGS: The lungs are well expanded. There are patchy pulmonary infiltrates within the peripheral right mid and lower lung zone similar in appearance to prior examination possibly representing a small amount of post inflammatory scarring given its stability over time. No definite superimposed acute pulmonary infiltrate. No pneumothorax. Small left pleural effusion is suspected. Mild cardiomegaly is stable. Pulmonary vascularity is normal. Right total shoulder arthroplasty has been performed. Aunts degenerative changes are seen within the left shoulder. IMPRESSION: Probable post  inflammatory scarring within the right lung. No definite acute cardiopulmonary disease detected. Stable cardiomegaly. Electronically Signed   By: Helyn Numbers MD   On: 12/12/2020 13:41    EKG: Atrial fibrillation with nonspecific ST changes  Weights: Filed Weights   12/12/20 1058  Weight: 90.7 kg     Physical Exam: Blood pressure (!) 151/102, pulse (!) 104, temperature 98 F (36.7 C), temperature source Oral, resp. rate (!) 25, height 5\' 4"  (1.626 m), weight 90.7 kg, SpO2 95 %. Body mass index is 34.33 kg/m. General: Well developed, well nourished, in no acute distress. Head eyes ears nose throat: Normocephalic, atraumatic, sclera non-icteric, no xanthomas, nares are without discharge. No apparent thyromegaly and/or mass  Lungs: Normal respiratory effort.  no wheezes, no rales, no rhonchi.  Heart: Irregular with normal S1 S2. no murmur gallop, no rub, PMI is normal size and placement, carotid upstroke normal without bruit, jugular venous pressure is normal Abdomen: Soft, non-tender, non-distended with normoactive bowel sounds. No hepatomegaly. No rebound/guarding. No obvious abdominal masses. Abdominal aorta is normal size without bruit Extremities: Trace to 1+ edema. no cyanosis, no clubbing, no ulcers  Peripheral : 2+ bilateral upper extremity pulses, 2+ bilateral femoral pulses, 2+ bilateral dorsal pedal pulse Neuro: Alert and oriented. No facial asymmetry. No focal deficit. Moves all extremities spontaneously. Musculoskeletal: Normal muscle tone without kyphosis Psych:  Responds to questions appropriately with a normal affect.    Assessment: 86 year old female with diabetes hypertension hyperlipidemia chronic kidney disease stage III with acute onset of atrial fibrillation with rapid ventricular rate without evidence of congestive heart failure or myocardial infarction  Plan: 1.  Begin metoprolol tartrate orally in addition to intravenous diltiazem for heart rate control of  atrial fibrillation with a goal heart rate between 60 and 100 bpm 2.  Anticoagulation with heparin for further risk reduction and stroke with atrial fibrillation and consider additional changes to anticoagulation orally 3.  Echocardiogram for LV systolic dysfunction valvular heart disease contributing to 4.  Continue treatment of kidney issues 5.  Begin ambulation and follow-up for improvements of symptoms and adjustments of medication  Signed, 97 M.D. [facility] 12/12/2020, 8:29 PM

## 2020-12-12 NOTE — ED Notes (Signed)
This tech performed bladder scan. Pt showing 57mL of fluid in bladder at this time. Shanda Bumps RN made aware and asked to recheck.

## 2020-12-12 NOTE — ED Triage Notes (Signed)
Pt comes pov with urinary retention since last night and swelling in her hands and feet.

## 2020-12-13 ENCOUNTER — Observation Stay
Admit: 2020-12-13 | Discharge: 2020-12-13 | Disposition: A | Discharge status: Home / Self Care | Payer: Medicare Other | Attending: Internal Medicine | Admitting: Internal Medicine

## 2020-12-13 DIAGNOSIS — I129 Hypertensive chronic kidney disease with stage 1 through stage 4 chronic kidney disease, or unspecified chronic kidney disease: Secondary | ICD-10-CM | POA: Diagnosis not present

## 2020-12-13 DIAGNOSIS — N1831 Chronic kidney disease, stage 3a: Secondary | ICD-10-CM | POA: Diagnosis not present

## 2020-12-13 DIAGNOSIS — E1122 Type 2 diabetes mellitus with diabetic chronic kidney disease: Secondary | ICD-10-CM | POA: Diagnosis not present

## 2020-12-13 DIAGNOSIS — Z79899 Other long term (current) drug therapy: Secondary | ICD-10-CM | POA: Diagnosis not present

## 2020-12-13 DIAGNOSIS — Z66 Do not resuscitate: Secondary | ICD-10-CM | POA: Diagnosis not present

## 2020-12-13 DIAGNOSIS — U071 COVID-19: Secondary | ICD-10-CM | POA: Diagnosis not present

## 2020-12-13 DIAGNOSIS — Z885 Allergy status to narcotic agent status: Secondary | ICD-10-CM | POA: Diagnosis not present

## 2020-12-13 DIAGNOSIS — J9601 Acute respiratory failure with hypoxia: Secondary | ICD-10-CM | POA: Diagnosis not present

## 2020-12-13 DIAGNOSIS — R339 Retention of urine, unspecified: Secondary | ICD-10-CM | POA: Diagnosis not present

## 2020-12-13 DIAGNOSIS — Z8249 Family history of ischemic heart disease and other diseases of the circulatory system: Secondary | ICD-10-CM | POA: Diagnosis not present

## 2020-12-13 DIAGNOSIS — F32A Depression, unspecified: Secondary | ICD-10-CM | POA: Diagnosis not present

## 2020-12-13 DIAGNOSIS — I4891 Unspecified atrial fibrillation: Secondary | ICD-10-CM | POA: Diagnosis present

## 2020-12-13 DIAGNOSIS — Z6841 Body Mass Index (BMI) 40.0 and over, adult: Secondary | ICD-10-CM | POA: Diagnosis not present

## 2020-12-13 DIAGNOSIS — K219 Gastro-esophageal reflux disease without esophagitis: Secondary | ICD-10-CM | POA: Diagnosis not present

## 2020-12-13 DIAGNOSIS — Z7982 Long term (current) use of aspirin: Secondary | ICD-10-CM | POA: Diagnosis not present

## 2020-12-13 DIAGNOSIS — Z8701 Personal history of pneumonia (recurrent): Secondary | ICD-10-CM | POA: Diagnosis not present

## 2020-12-13 DIAGNOSIS — H9190 Unspecified hearing loss, unspecified ear: Secondary | ICD-10-CM | POA: Diagnosis not present

## 2020-12-13 DIAGNOSIS — Z87440 Personal history of urinary (tract) infections: Secondary | ICD-10-CM | POA: Diagnosis not present

## 2020-12-13 DIAGNOSIS — E669 Obesity, unspecified: Secondary | ICD-10-CM | POA: Diagnosis not present

## 2020-12-13 DIAGNOSIS — M810 Age-related osteoporosis without current pathological fracture: Secondary | ICD-10-CM | POA: Diagnosis not present

## 2020-12-13 DIAGNOSIS — E785 Hyperlipidemia, unspecified: Secondary | ICD-10-CM | POA: Diagnosis not present

## 2020-12-13 DIAGNOSIS — M1990 Unspecified osteoarthritis, unspecified site: Secondary | ICD-10-CM | POA: Diagnosis not present

## 2020-12-13 LAB — ECHOCARDIOGRAM COMPLETE
AR max vel: 3 cm2
AV Area VTI: 3.02 cm2
AV Area mean vel: 3.28 cm2
AV Mean grad: 3 mmHg
AV Peak grad: 6.4 mmHg
Ao pk vel: 1.26 m/s
Area-P 1/2: 5.79 cm2
Height: 64 in
MV VTI: 1.96 cm2
S' Lateral: 5.5 cm
Weight: 3200 oz

## 2020-12-13 MED ORDER — SODIUM CHLORIDE 0.9 % IV SOLN
100.0000 mg | Freq: Every day | INTRAVENOUS | Status: DC
Start: 2020-12-14 — End: 2020-12-14
  Filled 2020-12-13 (×2): qty 20

## 2020-12-13 MED ORDER — SODIUM CHLORIDE 0.9 % IV SOLN
200.0000 mg | Freq: Once | INTRAVENOUS | Status: AC
  Administered 2020-12-13: 200 mg via INTRAVENOUS
  Filled 2020-12-13: qty 200

## 2020-12-13 MED ORDER — METHYLPREDNISOLONE SODIUM SUCC 40 MG IJ SOLR
40.0000 mg | Freq: Two times a day (BID) | INTRAMUSCULAR | Status: DC
  Administered 2020-12-13 – 2020-12-14 (×2): 40 mg via INTRAVENOUS
  Filled 2020-12-13 (×2): qty 1

## 2020-12-13 NOTE — ED Notes (Addendum)
Pt asleep in room. Respirs even and unlabored.

## 2020-12-13 NOTE — Progress Notes (Signed)
Remdesivir - Pharmacy Brief Note   O:  CXR: post inflammatory scarring SpO2: 93% on 2L   A/P:  Remdesivir 200 mg IVPB once followed by 100 mg IVPB daily x 4 days.   Clovia Cuff, PharmD, BCPS 12/13/2020 2:30 PM

## 2020-12-13 NOTE — ED Notes (Signed)
Pt ambulatory with 1 assist to toilet

## 2020-12-13 NOTE — ED Notes (Signed)
Pt becomes very short of breath when ambulating approx 8 ft to toilet. Pt continues to refuse purewick, even after education about concern for safety .

## 2020-12-13 NOTE — ED Notes (Signed)
Pt becomes very short of breath after ambulating to toilet. O2 sat remains above 94% on 2L [place].

## 2020-12-13 NOTE — ED Notes (Signed)
Pt requesting and provided remote for TV. Gave son, Kathlene November, update

## 2020-12-13 NOTE — Progress Notes (Signed)
[facility] Cardiology [facility] Encounter Note  Patient: Briana Cook / Admit Date: 12/12/2020 / Date of Encounter: 12/13/2020, 7:36 AM   Subjective: Patient overall improved since yesterday with no evidence of significant chest discomfort or congestive heart failure type symptoms.  Patient did have urinary issues but these are resolved at this time.  The patient has had continued atrial fibrillation although heart rate is much better controlled with diltiazem drip and oral diltiazem as well as oral metoprolol.  Heart rate around 60 to 80 bpm at rest.  Patient tolerating anticoagulation  Review of Systems: Positive for: None Negative for: Vision change, hearing change, syncope, dizziness, nausea, vomiting,diarrhea, bloody stool, stomach pain, cough, congestion, diaphoresis, urinary frequency, urinary pain,skin lesions, skin rashes Others previously listed  Objective: Telemetry: Atrial fibrillation with controlled ventricular rate Physical Exam: Blood pressure 113/85, pulse 61, temperature 98 F (36.7 C), temperature source Oral, resp. rate 17, height 5\' 4"  (1.626 m), weight 90.7 kg, SpO2 96 %. Body mass index is 34.33 kg/m. General: Well developed, well nourished, in no acute distress. Head: Normocephalic, atraumatic, sclera non-icteric, no xanthomas, nares are without discharge. Neck: No apparent masses Lungs: Normal respirations with no wheezes, no rhonchi, no rales , no crackles   Heart: Irregular rate and rhythm, normal S1 S2, no murmur, no rub, no gallop, PMI is normal size and placement, carotid upstroke normal without bruit, jugular venous pressure normal Abdomen: Soft, non-tender,  distended with normoactive bowel sounds. No hepatosplenomegaly. Abdominal aorta is normal size without bruit Extremities: Trace edema, no clubbing, no cyanosis, no ulcers,  Peripheral: 2+ radial, 2+ femoral, 2+ dorsal pedal pulses Neuro: Alert and oriented. Moves all extremities  spontaneously. Psych:  Responds to questions appropriately with a normal affect.   Intake/Output Summary (Last 24 hours) at 12/13/2020 0736 Last data filed at 12/13/2020 02/12/2021 Gross per 24 hour  Intake 67.88 ml  Output --  Net 67.88 ml    Inpatient Medications:  . apixaban  5 mg Oral BID  . atorvastatin  20 mg Oral QHS  . calcium-vitamin D  1 tablet Oral Daily  . diltiazem  240 mg Oral Daily  . DULoxetine  60 mg Oral Daily  . metoprolol succinate  50 mg Oral BID  . pantoprazole  40 mg Oral BID  . sodium chloride flush  3 mL Intravenous Q12H  . vitamin B-12  1,000 mcg Oral Daily   Infusions:  . sodium chloride      Labs: Recent Labs    12/12/20 1145  NA 140  K 4.0  CL 108  CO2 23  GLUCOSE 116*  BUN 22  CREATININE 1.16*  CALCIUM 8.8*   Recent Labs    12/12/20 1145  AST 15  ALT 12  ALKPHOS 41  BILITOT 1.4*  PROT 6.1*  ALBUMIN 3.3*   Recent Labs    12/12/20 1145  WBC 7.9  NEUTROABS 6.3  HGB 11.0*  HCT 35.0*  MCV 88.2  PLT 403*   No results for input(s): CKTOTAL, CKMB, TROPONINI in the last 72 hours. Invalid input(s): POCBNP No results for input(s): HGBA1C in the last 72 hours.   Weights: Filed Weights   12/12/20 1058  Weight: 90.7 kg     Radiology/Studies:  DG Chest Portable 1 View  Result Date: 12/12/2020 CLINICAL DATA:  Dyspnea EXAM: PORTABLE CHEST 1 VIEW COMPARISON:  04/06/2018 FINDINGS: The lungs are well expanded. There are patchy pulmonary infiltrates within the peripheral right mid and lower lung zone similar in appearance to  prior examination possibly representing a small amount of post inflammatory scarring given its stability over time. No definite superimposed acute pulmonary infiltrate. No pneumothorax. Small left pleural effusion is suspected. Mild cardiomegaly is stable. Pulmonary vascularity is normal. Right total shoulder arthroplasty has been performed. Aunts degenerative changes are seen within the left shoulder. IMPRESSION: Probable  post inflammatory scarring within the right lung. No definite acute cardiopulmonary disease detected. Stable cardiomegaly. Electronically Signed   By: Helyn Numbers MD   On: 12/12/2020 13:41     Assessment and Recommendation  86 y.o. female with known chronic kidney disease stage III diabetes hypertension hyperlipidemia with urinary complaints and now new onset atrial fibrillation rapid ventricular rate improved with medication management 1.  Change diltiazem drip to Cardizem 240 mg CD as well as metoprolol combination for goal heart rate of 60 to 90 bpm 2.  Continue anticoagulation for further risk reduction and stroke with atrial fibrillation 3.  Echocardiogram for further evaluation of cardiovascular causes and LV dysfunction 4.  Further supportive care of urinary symptoms Signed, Arnoldo Hooker M.D. FACC

## 2020-12-13 NOTE — Plan of Care (Signed)

## 2020-12-13 NOTE — Progress Notes (Signed)
*  PRELIMINARY RESULTS* Echocardiogram 2D Echocardiogram has been performed.  Briana Cook 12/13/2020, 10:37 AM

## 2020-12-13 NOTE — ED Notes (Signed)
Pt provided meal tray

## 2020-12-13 NOTE — Progress Notes (Signed)
PROGRESS NOTE    Briana Cook  VPX:[id number] DOB: 26-Aug-1934 DOA: 12/12/2020 PCP: Patient, No Pcp Per (Inactive)    Brief Narrative:  Briana Cook is a 86 y.o. female with medical history significant for diabetes mellitus with stage IIIa chronic kidney disease, hypertension, depression, GERD and dyslipidemia who presents to the ER via private vehicle for evaluation of worsening swelling in her hands and feet as well as difficulty voiding. Patient was a found to have atrial fibrillation with rapid ventricular rates, she is given diltiazem, started on Eliquis.  She was also tested positive for COVID.  She started have hypoxemia.  Started remdesivir and steroids  Assessment & Plan:   Principal Problem:   Atrial fibrillation with RVR (HCC) Active Problems:   HTN (hypertension)   GERD (gastroesophageal reflux disease)   CKD stage 3 due to type 2 diabetes mellitus (HCC)   Depression   COVID-19 virus detected   Acute hypoxemic respiratory failure due to COVID-19 (HCC)  #1.  Acute hypoxemic respiratory failure secondary to COVID infection. COVID infection. Patient developed hypoxemia this morning, is placed on 2 L oxygen. I will start remdesivir and IV steroids.  Patient can be discharged home tomorrow if she is off oxygen.  #2.  New onset atrial fibrillation with rapid ventricular response. Patient has been seen by cardiology, heart rate much better.  Also started on Eliquis. Will continue follow-up  3.  Obesity.  #4 essential hypertension plan Continue beta-blocker.    DVT prophylaxis: Eliquis Code Status: DNR Family Communication:  Disposition Plan:    Status is: Inpatient  Remains inpatient appropriate because:IV treatments appropriate due to intensity of illness or inability to take PO and Inpatient level of care appropriate due to severity of illness  Dispo: The patient is from: Home              Anticipated d/c is to: Home              Patient currently is  not medically stable to d/c.   Difficult to place patient No        No intake/output data recorded. Total I/O In: 67.9 [I.V.:67.9] Out: -      Consultants:  None  Procedures: None  Antimicrobials: None   Subjective: Patient developed some hypoxemia, was placed on 2 L oxygen.  She has some short of breath with exertion.  She has no cough. She denies any abdominal pain or nausea vomiting. No dysuria hematuria No fever chills.  Objective: Vitals:   12/13/20 1101 12/13/20 1130 12/13/20 1200 12/13/20 1300  BP: (!) 155/83 126/83 (!) 137/114 (!) 115/95  Pulse: (!) 31 72 82 78  Resp: (!) 30 (!) 23 19 (!) 26  Temp:      TempSrc:      SpO2: 98% 100% 98% 93%  Weight:      Height:        Intake/Output Summary (Last 24 hours) at 12/13/2020 1332 Last data filed at 12/13/2020 0724 Gross per 24 hour  Intake 67.88 ml  Output --  Net 67.88 ml   Filed Weights   12/12/20 1058  Weight: 90.7 kg    Examination:  General exam: Appears calm and comfortable  Respiratory system: Clear to auscultation. Respiratory effort normal. Cardiovascular system: S1 & S2 heard, RRR. No JVD, murmurs, rubs, gallops or clicks. No pedal edema. Gastrointestinal system: Abdomen is nondistended, soft and nontender. No organomegaly or masses felt. Normal bowel sounds heard. Central nervous system: Alert and oriented x3.  No focal neurological deficits. Extremities: Symmetric 5 x 5 power. Skin: No rashes, lesions or ulcers Psychiatry: Judgement and insight appear normal. Mood & affect appropriate.     Data Reviewed: I have personally reviewed following labs and imaging studies  CBC: Recent Labs  Lab 12/12/20 1145  WBC 7.9  NEUTROABS 6.3  HGB 11.0*  HCT 35.0*  MCV 88.2  PLT 403*   Basic Metabolic Panel: Recent Labs  Lab 12/12/20 1145  NA 140  K 4.0  CL 108  CO2 23  GLUCOSE 116*  BUN 22  CREATININE 1.16*  CALCIUM 8.8*   GFR: Estimated Creatinine Clearance: 39.4 mL/min (A) (by  C-G formula based on SCr of 1.16 mg/dL (H)). Liver Function Tests: Recent Labs  Lab 12/12/20 1145  AST 15  ALT 12  ALKPHOS 41  BILITOT 1.4*  PROT 6.1*  ALBUMIN 3.3*   No results for input(s): LIPASE, AMYLASE in the last 168 hours. No results for input(s): AMMONIA in the last 168 hours. Coagulation Profile: No results for input(s): INR, PROTIME in the last 168 hours. Cardiac Enzymes: No results for input(s): CKTOTAL, CKMB, CKMBINDEX, TROPONINI in the last 168 hours. BNP (last 3 results) No results for input(s): PROBNP in the last 8760 hours. HbA1C: No results for input(s): HGBA1C in the last 72 hours. CBG: No results for input(s): GLUCAP in the last 168 hours. Lipid Profile: No results for input(s): CHOL, HDL, LDLCALC, TRIG, CHOLHDL, LDLDIRECT in the last 72 hours. Thyroid Function Tests: Recent Labs    12/12/20 1349  TSH 0.983   Anemia Panel: No results for input(s): VITAMINB12, FOLATE, FERRITIN, TIBC, IRON, RETICCTPCT in the last 72 hours. Sepsis Labs: No results for input(s): PROCALCITON, LATICACIDVEN in the last 168 hours.  Recent Results (from the past 240 hour(s))  Resp Panel by RT-PCR (Flu A&B, Covid) Nasopharyngeal Swab     Status: Abnormal   Collection Time: 12/12/20 11:55 AM   Specimen: Nasopharyngeal Swab; Nasopharyngeal(NP) swabs in vial transport medium  Result Value Ref Range Status   SARS Coronavirus 2 by RT PCR POSITIVE (A) NEGATIVE Final    Comment: RESULT CALLED TO, READ BACK BY AND VERIFIED WITH: LAURA STANFIELD AT 1258 12/12/20.PMF (NOTE) SARS-CoV-2 target nucleic acids are DETECTED.  The SARS-CoV-2 RNA is generally detectable in upper respiratory specimens during the acute phase of infection. Positive results are indicative of the presence of the identified virus, but do not rule out bacterial infection or co-infection with other pathogens not detected by the test. Clinical correlation with patient history and other diagnostic information is  necessary to determine patient infection status. The expected result is Negative.  Fact Sheet for Patients: BloggerCourse.com  Fact Sheet for Healthcare Providers: SeriousBroker.it  This test is not yet approved or cleared by the [place] FDA and  has been authorized for detection and/or diagnosis of SARS-CoV-2 by FDA under an Emergency Use Authorization (EUA).  This EUA will remain in effect (meaning this test can b e used) for the duration of  the COVID-19 declaration under Section 564(b)(1) of the Act, 21 U.S.C. section 360bbb-3(b)(1), unless the authorization is terminated or revoked sooner.     Influenza A by PCR NEGATIVE NEGATIVE Final   Influenza B by PCR NEGATIVE NEGATIVE Final    Comment: (NOTE) The Xpert Xpress SARS-CoV-2/FLU/RSV plus assay is intended as an aid in the diagnosis of influenza from Nasopharyngeal swab specimens and should not be used as a sole basis for treatment. Nasal washings and aspirates are unacceptable for Xpert  Xpress SARS-CoV-2/FLU/RSV testing.  Fact Sheet for Patients: BloggerCourse.com  Fact Sheet for Healthcare Providers: SeriousBroker.it  This test is not yet approved or cleared by the [place] FDA and has been authorized for detection and/or diagnosis of SARS-CoV-2 by FDA under an Emergency Use Authorization (EUA). This EUA will remain in effect (meaning this test can be used) for the duration of the COVID-19 declaration under Section 564(b)(1) of the Act, 21 U.S.C. section 360bbb-3(b)(1), unless the authorization is terminated or revoked.  Performed at [facility], [address], [place], [place] [address]          Radiology Studies: DG Chest Portable 1 View  Result Date: 12/12/2020 CLINICAL DATA:  Dyspnea EXAM: PORTABLE CHEST 1 VIEW COMPARISON:  04/06/2018 FINDINGS: The lungs are well expanded. There are  patchy pulmonary infiltrates within the peripheral right mid and lower lung zone similar in appearance to prior examination possibly representing a small amount of post inflammatory scarring given its stability over time. No definite superimposed acute pulmonary infiltrate. No pneumothorax. Small left pleural effusion is suspected. Mild cardiomegaly is stable. Pulmonary vascularity is normal. Right total shoulder arthroplasty has been performed. Aunts degenerative changes are seen within the left shoulder. IMPRESSION: Probable post inflammatory scarring within the right lung. No definite acute cardiopulmonary disease detected. Stable cardiomegaly. Electronically Signed   By: Helyn Numbers MD   On: 12/12/2020 13:41        Scheduled Meds:  apixaban  5 mg Oral BID   atorvastatin  20 mg Oral QHS   calcium-vitamin D  1 tablet Oral Daily   diltiazem  240 mg Oral Daily   DULoxetine  60 mg Oral Daily   methylPREDNISolone (SOLU-MEDROL) injection  40 mg Intravenous Q12H   metoprolol succinate  50 mg Oral BID   pantoprazole  40 mg Oral BID   sodium chloride flush  3 mL Intravenous Q12H   vitamin B-12  1,000 mcg Oral Daily   Continuous Infusions:  sodium chloride       LOS: 0 days    Time spent: 27 minutes    Marrion Coy, MD [facility]   To contact the attending provider between 7A-7P or the covering provider during after hours 7P-7A, please log into the web site [email or web address] and access using universal [place] password for that web site. If you do not have the password, please call the hospital operator.  12/13/2020, 1:32 PM

## 2020-12-14 DIAGNOSIS — U071 COVID-19: Secondary | ICD-10-CM | POA: Diagnosis not present

## 2020-12-14 DIAGNOSIS — J9601 Acute respiratory failure with hypoxia: Secondary | ICD-10-CM | POA: Diagnosis not present

## 2020-12-14 DIAGNOSIS — I4891 Unspecified atrial fibrillation: Secondary | ICD-10-CM | POA: Diagnosis not present

## 2020-12-14 LAB — CBC WITH DIFFERENTIAL/PLATELET
Abs Immature Granulocytes: 0.03 10*3/uL (ref 0.00–0.07)
Basophils Absolute: 0 10*3/uL (ref 0.0–0.1)
Basophils Relative: 0 %
Eosinophils Absolute: 0 10*3/uL (ref 0.0–0.5)
Eosinophils Relative: 0 %
HCT: 32.5 % — ABNORMAL LOW (ref 36.0–46.0)
Hemoglobin: 10.2 g/dL — ABNORMAL LOW (ref 12.0–15.0)
Immature Granulocytes: 1 %
Lymphocytes Relative: 7 %
Lymphs Abs: 0.4 10*3/uL — ABNORMAL LOW (ref 0.7–4.0)
MCH: 27.2 pg (ref 26.0–34.0)
MCHC: 31.4 g/dL (ref 30.0–36.0)
MCV: 86.7 fL (ref 80.0–100.0)
Monocytes Absolute: 0.1 10*3/uL (ref 0.1–1.0)
Monocytes Relative: 2 %
Neutro Abs: 5.9 10*3/uL (ref 1.7–7.7)
Neutrophils Relative %: 90 %
Platelets: 388 10*3/uL (ref 150–400)
RBC: 3.75 MIL/uL — ABNORMAL LOW (ref 3.87–5.11)
RDW: 18.3 % — ABNORMAL HIGH (ref 11.5–15.5)
WBC: 6.5 10*3/uL (ref 4.0–10.5)
nRBC: 0 % (ref 0.0–0.2)

## 2020-12-14 LAB — BASIC METABOLIC PANEL
Anion gap: 8 (ref 5–15)
BUN: 19 mg/dL (ref 8–23)
CO2: 24 mmol/L (ref 22–32)
Calcium: 8.6 mg/dL — ABNORMAL LOW (ref 8.9–10.3)
Chloride: 107 mmol/L (ref 98–111)
Creatinine, Ser: 1.03 mg/dL — ABNORMAL HIGH (ref 0.44–1.00)
GFR, Estimated: 54 mL/min — ABNORMAL LOW (ref >60)
Glucose, Bld: 129 mg/dL — ABNORMAL HIGH (ref 70–99)
Potassium: 4 mmol/L (ref 3.5–5.1)
Sodium: 139 mmol/L (ref 135–145)

## 2020-12-14 LAB — MAGNESIUM: Magnesium: 1.8 mg/dL (ref 1.7–2.4)

## 2020-12-14 LAB — GLUCOSE, CAPILLARY
Glucose-Capillary: 114 mg/dL — ABNORMAL HIGH (ref 70–99)
Glucose-Capillary: 185 mg/dL — ABNORMAL HIGH (ref 70–99)

## 2020-12-14 MED ORDER — APIXABAN 5 MG PO TABS: 5.0000 mg | ORAL_TABLET | Freq: Two times a day (BID) | ORAL | 0 refills | Status: AC

## 2020-12-14 MED ORDER — DEXAMETHASONE 6 MG PO TABS: 6.0000 mg | ORAL_TABLET | Freq: Every day | ORAL | 0 refills | Status: AC

## 2020-12-14 MED ORDER — DILTIAZEM HCL ER COATED BEADS 240 MG PO CP24: 240.0000 mg | ORAL_CAPSULE | Freq: Every day | ORAL | 0 refills | Status: AC

## 2020-12-14 NOTE — Progress Notes (Signed)
[facility]  Patient: Briana Cook / Admit Date: 12/12/2020 / Date of Encounter: 12/14/2020, 1:10 PM   Subjective: 8/8.  Patient overall improved since yesterday with no evidence of significant chest discomfort or congestive heart failure type symptoms.  Patient did have urinary issues but these are resolved at this time.  The patient has had continued atrial fibrillation although heart rate is much better controlled with diltiazem drip and oral diltiazem as well as oral metoprolol.  Heart rate around 60 to 80 bpm at rest.  Patient tolerating anticoagulation  8/9.  Patient apparently has not had any further significant cardiovascular symptoms although still has variable heart rate depending on ambulation with atrial fibrillation.  May require slight adjustments of medication management  Echocardiogram showing global LV systolic dysfunction with ejection fraction of 35% possibly consistent with atrial fibrillation with rapid ventricular rate  Review of Systems: Positive for: None Negative for: Vision change, hearing change, syncope, dizziness, nausea, vomiting,diarrhea, bloody stool, stomach pain, cough, congestion, diaphoresis, urinary frequency, urinary pain,skin lesions, skin rashes Others previously listed  Objective: Telemetry: Atrial fibrillation with controlled ventricular rate Physical Exam: Blood pressure 129/77, pulse 100, temperature 98.1 F (36.7 C), resp. rate 17, height 5\' 4"  (1.626 m), weight 90.7 kg, SpO2 98 %. Body mass index is 34.33 kg/m. General: Well developed, well nourished, in no acute distress. Head: Normocephalic, atraumatic, sclera non-icteric, no xanthomas, nares are without discharge. Neck: No apparent masses Lungs: Normal respirations with no wheezes, no rhonchi, no rales , no crackles   Heart: Irregular rate and rhythm, normal S1 S2, no murmur, no rub, no gallop, PMI is normal size and placement, carotid upstroke normal  without bruit, jugular venous pressure normal Abdomen: Soft, non-tender,  distended with normoactive bowel sounds. No hepatosplenomegaly. Abdominal aorta is normal size without bruit Extremities: Trace edema, no clubbing, no cyanosis, no ulcers,  Peripheral: 2+ radial, 2+ femoral, 2+ dorsal pedal pulses Neuro: Alert and oriented. Moves all extremities spontaneously. Psych:  Responds to questions appropriately with a normal affect.   Intake/Output Summary (Last 24 hours) at 12/14/2020 1310 Last data filed at 12/14/2020 0809 Gross per 24 hour  Intake 627.35 ml  Output 200 ml  Net 427.35 ml     Inpatient Medications:  . apixaban  5 mg Oral BID  . atorvastatin  20 mg Oral QHS  . calcium-vitamin D  1 tablet Oral Daily  . diltiazem  240 mg Oral Daily  . DULoxetine  60 mg Oral Daily  . methylPREDNISolone (SOLU-MEDROL) injection  40 mg Intravenous Q12H  . pantoprazole  40 mg Oral BID  . sodium chloride flush  3 mL Intravenous Q12H  . vitamin B-12  1,000 mcg Oral Daily   Infusions:  . sodium chloride    . remdesivir 100 mg in NS 100 mL      Labs: Recent Labs    12/12/20 1145 12/14/20 0637  NA 140 139  K 4.0 4.0  CL 108 107  CO2 23 24  GLUCOSE 116* 129*  BUN 22 19  CREATININE 1.16* 1.03*  CALCIUM 8.8* 8.6*  MG  --  1.8    Recent Labs    12/12/20 1145  AST 15  ALT 12  ALKPHOS 41  BILITOT 1.4*  PROT 6.1*  ALBUMIN 3.3*    Recent Labs    12/12/20 1145 12/14/20 0637  WBC 7.9 6.5  NEUTROABS 6.3 5.9  HGB 11.0* 10.2*  HCT 35.0* 32.5*  MCV 88.2 86.7  PLT  403* 388    No results for input(s): CKTOTAL, CKMB, TROPONINI in the last 72 hours. Invalid input(s): POCBNP No results for input(s): HGBA1C in the last 72 hours.   Weights: Filed Weights   12/12/20 1058  Weight: 90.7 kg     Radiology/Studies:  DG Chest Portable 1 View  Result Date: 12/12/2020 CLINICAL DATA:  Dyspnea EXAM: PORTABLE CHEST 1 VIEW COMPARISON:  04/06/2018 FINDINGS: The lungs are well  expanded. There are patchy pulmonary infiltrates within the peripheral right mid and lower lung zone similar in appearance to prior examination possibly representing a small amount of post inflammatory scarring given its stability over time. No definite superimposed acute pulmonary infiltrate. No pneumothorax. Small left pleural effusion is suspected. Mild cardiomegaly is stable. Pulmonary vascularity is normal. Right total shoulder arthroplasty has been performed. Aunts degenerative changes are seen within the left shoulder. IMPRESSION: Probable post inflammatory scarring within the right lung. No definite acute cardiopulmonary disease detected. Stable cardiomegaly. Electronically Signed   By: Helyn Numbers MD   On: 12/12/2020 13:41   ECHOCARDIOGRAM COMPLETE  Result Date: 12/13/2020    ECHOCARDIOGRAM REPORT   Patient Name:   Briana Cook Date of Exam: 12/13/2020 Medical Rec #:  [id number]         Height:       64.0 in Accession #:    [id number]        Weight:       200.0 lb Date of Birth:  1935/01/07         BSA:          1.956 m Patient Age:    86 years          BP:           151/68 mmHg Patient Gender: F                 HR:           82 bpm. Exam Location:  [facility] Procedure: 2D Echo, Color Doppler and Cardiac Doppler Indications:     I48.91 Atrial fibrillation  History:         Patient has prior history of Echocardiogram examinations.                  Signs/Symptoms:Palpitations; Risk Factors:Dyslipidemia. Pt                  tested positive for COVID-19 on 12/12/20.  Sonographer:     Humphrey Rolls RDCS (AE) Referring Phys:  [id number] [employer]ters Diagnosing Phys: Harold Hedge MD  Sonographer Comments: Suboptimal parasternal window and suboptimal subcostal window. IMPRESSIONS  1. Left ventricular ejection fraction, by estimation, is 30 to 35%. The left ventricle has moderately decreased function. The left ventricle demonstrates global hypokinesis. Left ventricular diastolic parameters were normal.  2. Right  ventricular systolic function is normal. The right ventricular size is normal.  3. Left atrial size was mildly dilated.  4. Right atrial size was mildly dilated.  5. The mitral valve is grossly normal. Mild mitral valve regurgitation.  6. The aortic valve was not well visualized. Aortic valve regurgitation is trivial. FINDINGS  Left Ventricle: Left ventricular ejection fraction, by estimation, is 30 to 35%. The left ventricle has moderately decreased function. The left ventricle demonstrates global hypokinesis. The left ventricular internal cavity size was normal in size. There is no left ventricular hypertrophy. Left ventricular diastolic parameters were normal. Right Ventricle: The right ventricular size is normal. No increase in right ventricular wall  thickness. Right ventricular systolic function is normal. Left Atrium: Left atrial size was mildly dilated. Right Atrium: Right atrial size was mildly dilated. Pericardium: There is no evidence of pericardial effusion. Mitral Valve: The mitral valve is grossly normal. Mild mitral valve regurgitation. MV peak gradient, 8.0 mmHg. The mean mitral valve gradient is 3.0 mmHg. Tricuspid Valve: The tricuspid valve is grossly normal. Tricuspid valve regurgitation is trivial. Aortic Valve: The aortic valve was not well visualized. Aortic valve regurgitation is trivial. Aortic valve mean gradient measures 3.0 mmHg. Aortic valve peak gradient measures 6.4 mmHg. Aortic valve area, by VTI measures 3.02 cm. Pulmonic Valve: The pulmonic valve was not well visualized. Pulmonic valve regurgitation is mild. Aorta: The aortic root is normal in size and structure. IAS/Shunts: The interatrial septum was not well visualized.  LEFT VENTRICLE PLAX 2D LVIDd:         6.30 cm  Diastology LVIDs:         5.50 cm  LV e' lateral:   8.27 cm/s LV PW:         1.20 cm  LV E/e' lateral: 12.5 LV IVS:        0.80 cm LVOT diam:     2.20 cm LV SV:         60 LV SV Index:   31 LVOT Area:     3.80 cm   RIGHT VENTRICLE RV Basal diam:  3.60 cm LEFT ATRIUM             Index       RIGHT ATRIUM           Index LA diam:        4.70 cm 2.40 cm/m  RA Area:     22.10 cm LA Vol (A2C):   49.1 ml 25.10 ml/m RA Volume:   61.80 ml  31.59 ml/m LA Vol (A4C):   41.6 ml 21.26 ml/m LA Biplane Vol: 45.3 ml 23.16 ml/m  AORTIC VALVE                   PULMONIC VALVE AV Area (Vmax):    3.00 cm    PV Vmax:       0.84 m/s AV Area (Vmean):   3.28 cm    PV Vmean:      56.200 cm/s AV Area (VTI):     3.02 cm    PV VTI:        0.117 m AV Vmax:           126.00 cm/s PV Peak grad:  2.8 mmHg AV Vmean:          79.300 cm/s PV Mean grad:  1.0 mmHg AV VTI:            0.199 m AV Peak Grad:      6.4 mmHg AV Mean Grad:      3.0 mmHg LVOT Vmax:         99.60 cm/s LVOT Vmean:        68.400 cm/s LVOT VTI:          0.158 m LVOT/AV VTI ratio: 0.79  AORTA Ao Root diam: 3.00 cm MITRAL VALVE MV Area (PHT): 5.79 cm     SHUNTS MV Area VTI:   1.96 cm     Systemic VTI:  0.16 m MV Peak grad:  8.0 mmHg     Systemic Diam: 2.20 cm MV Mean grad:  3.0 mmHg MV Vmax:       1.41 m/s  MV Vmean:      77.1 cm/s MV Decel Time: 131 msec MV E velocity: 103.40 cm/s Harold Hedge MD Electronically signed by Harold Hedge MD Signature Date/Time: 12/13/2020/3:04:55 PM    Final      Assessment and Recommendation  86 y.o. female with known chronic kidney disease stage III diabetes hypertension hyperlipidemia with urinary complaints and now new onset atrial fibrillation rapid ventricular rate improved with medication management 1.  Continue diltiazem drip to Cardizem 240 mg CD as well as metoprolol combination for goal heart rate of 60 to 90 bpm 2.  Continue anticoagulation for further risk reduction and stroke with atrial fibrillation 3.  No further cardiac diagnostics necessary at this time 4.  Further supportive care of urinary symptoms 5.  If ambulating well with reasonable heart rate control as per above with no evidence of further symptoms okay for discharge home  from cardiac standpoint with follow-up in 1 week for further adjustments Signed, Arnoldo Hooker M.D. FACC

## 2020-12-14 NOTE — Discharge Summary (Signed)
Physician Discharge Summary  Patient ID: Briana Cook MRN: [id number] DOB/AGE: 84/04/1935 84 y.o.  Admit date: 12/12/2020 Discharge date: 12/14/2020  Admission Diagnoses:  Discharge Diagnoses:  Principal Problem:   Atrial fibrillation with RVR (HCC) Active Problems:   HTN (hypertension)   GERD (gastroesophageal reflux disease)   CKD stage 3 due to type 2 diabetes mellitus (HCC)   Depression   COVID-19 virus detected   Acute hypoxemic respiratory failure due to COVID-19 [facility])   Discharged Condition: good  Hospital Course:  Briana Cook is a 86 y.o. female with medical history significant for diabetes mellitus with stage IIIa chronic kidney disease, hypertension, depression, GERD and dyslipidemia who presents to the ER via private vehicle for evaluation of worsening swelling in her hands and feet as well as difficulty voiding. Patient was a found to have atrial fibrillation with rapid ventricular rates, she is given diltiazem, started on Eliquis.  She was also tested positive for COVID.  She started have hypoxemia.  Started remdesivir and steroids     #1.  Acute hypoxemic respiratory failure secondary to COVID infection. COVID infection. Patient is treated with remdesivir and IV steroids.  Condition improved.  She is off oxygen this morning, short of breath much better.  I will continue oral steroids for additional 8 days.     #2.  New onset atrial fibrillation with rapid ventricular response. Patient has been seen by cardiology, heart rate much better.  Also started on she will continue increased dose of metoprolol at 50 mg twice a day, continue diltiazem.  She will follow-up with cardiology in 1 week time.  3.  Obesity.   #4 essential hypertension plan Continue beta-blocker.     Consults: cardiology  Significant Diagnostic Studies:  PORTABLE CHEST 1 VIEW   COMPARISON:  04/06/2018   FINDINGS: The lungs are well expanded. There are patchy pulmonary  infiltrates within the peripheral right mid and lower lung zone similar in appearance to prior examination possibly representing a small amount of post inflammatory scarring given its stability over time. No definite superimposed acute pulmonary infiltrate. No pneumothorax. Small left pleural effusion is suspected. Mild cardiomegaly is stable. Pulmonary vascularity is normal. Right total shoulder arthroplasty has been performed. Aunts degenerative changes are seen within the left shoulder.   IMPRESSION: Probable post inflammatory scarring within the right lung. No definite acute cardiopulmonary disease detected.   Stable cardiomegaly.     Electronically Signed   By: Helyn Numbers MD   On: 12/12/2020 13:41    Treatments: diltiazem, metoprolol   Discharge Exam: Blood pressure 132/82, pulse 99, temperature 97.9 F (36.6 C), temperature source Oral, resp. rate 20, height 5\' 4"  (1.626 m), weight 90.7 kg, SpO2 98 %. General appearance: alert and cooperative Resp: clear to auscultation bilaterally Cardio: Irregularly irregular, mildly tachycardic.  No murmurs. GI: soft, non-tender; bowel sounds normal; no masses,  no organomegaly Extremities: extremities normal, atraumatic, no cyanosis or edema  Disposition: Discharge disposition: 01-Home or Self Care       Discharge Instructions     Amb referral to [facility]   Complete by: As directed    Diet - low sodium heart healthy   Complete by: As directed    Increase activity slowly   Complete by: As directed       Allergies as of 12/14/2020       Reactions   Oxycodone Other (See Comments)   Pt reports "It makes me crazy" Other reaction(s): Hallucination  Medication List     STOP taking these medications    albuterol 108 (90 Base) MCG/ACT inhaler Commonly known as: VENTOLIN HFA   amLODipine 5 MG tablet Commonly known as: NORVASC   omeprazole 20 MG capsule Commonly known as: PRILOSEC   ondansetron 4  MG disintegrating tablet Commonly known as: Zofran ODT       TAKE these medications    acetaminophen 500 MG tablet Commonly known as: TYLENOL Take 1,000 mg by mouth every 8 (eight) hours as needed.   apixaban 5 MG Tabs tablet Commonly known as: ELIQUIS Take 1 tablet (5 mg total) by mouth 2 (two) times daily.   aspirin EC 81 MG tablet Take 81 mg by mouth daily.   CALCIUM 600+D PO Take 2 tablets by mouth daily.   cetirizine 10 MG tablet Commonly known as: ZYRTEC Take 10 mg by mouth daily as needed.   dexamethasone 6 MG tablet Commonly known as: DECADRON Take 1 tablet (6 mg total) by mouth daily for 8 days.   diltiazem 240 MG 24 hr capsule Commonly known as: CARDIZEM CD Take 1 capsule (240 mg total) by mouth daily. Start taking on: December 15, 2020   DULoxetine 60 MG capsule Commonly known as: CYMBALTA Take 60 mg by mouth daily.   Icy Hot 5 % Ptch Generic drug: Menthol Apply 1 patch topically daily as needed (pain).   ICY HOT EX Apply 1 application topically daily as needed (pain).   metoprolol succinate 50 MG 24 hr tablet Commonly known as: TOPROL-XL Take 50 mg by mouth 2 (two) times daily. What changed: Another medication with the same name was removed. Continue taking this medication, and follow the directions you see here.   pantoprazole 40 MG tablet Commonly known as: PROTONIX Take 40 mg by mouth 2 (two) times daily before a meal.   simvastatin 40 MG tablet Commonly known as: ZOCOR Take 40 mg by mouth daily.   traMADol 50 MG tablet Commonly known as: ULTRAM Take 50-100 mg by mouth every 8 (eight) hours as needed for moderate pain.   vitamin B-12 1000 MCG tablet Commonly known as: CYANOCOBALAMIN Take 1,000 mcg by mouth daily.        Follow-up Information     Lamar Blinks, MD Follow up in 1 week(s).   Specialty: Cardiology Contact information: [address] [facility] [place] [address] [phone]                  Signed: Marrion Coy 12/14/2020, 12:51 PM

## 2020-12-25 ENCOUNTER — Inpatient Hospital Stay
Admission: EM | Admit: 2020-12-25 | Discharge: 2020-12-28 | DRG: 872 | Disposition: A | Discharge status: Home Health | Payer: Medicare Other | Attending: Internal Medicine | Admitting: Internal Medicine

## 2020-12-25 ENCOUNTER — Emergency Department: Payer: Medicare Other

## 2020-12-25 ENCOUNTER — Other Ambulatory Visit: Payer: Self-pay

## 2020-12-25 ENCOUNTER — Encounter: Payer: Self-pay | Admitting: Emergency Medicine

## 2020-12-25 DIAGNOSIS — R531 Weakness: Secondary | ICD-10-CM | POA: Diagnosis not present

## 2020-12-25 DIAGNOSIS — Z8249 Family history of ischemic heart disease and other diseases of the circulatory system: Secondary | ICD-10-CM

## 2020-12-25 DIAGNOSIS — N179 Acute kidney failure, unspecified: Secondary | ICD-10-CM | POA: Diagnosis present

## 2020-12-25 DIAGNOSIS — I10 Essential (primary) hypertension: Secondary | ICD-10-CM | POA: Diagnosis present

## 2020-12-25 DIAGNOSIS — F32A Depression, unspecified: Secondary | ICD-10-CM | POA: Diagnosis present

## 2020-12-25 DIAGNOSIS — A419 Sepsis, unspecified organism: Secondary | ICD-10-CM | POA: Diagnosis present

## 2020-12-25 DIAGNOSIS — K219 Gastro-esophageal reflux disease without esophagitis: Secondary | ICD-10-CM | POA: Diagnosis present

## 2020-12-25 DIAGNOSIS — E1122 Type 2 diabetes mellitus with diabetic chronic kidney disease: Secondary | ICD-10-CM | POA: Diagnosis present

## 2020-12-25 DIAGNOSIS — Z7901 Long term (current) use of anticoagulants: Secondary | ICD-10-CM

## 2020-12-25 DIAGNOSIS — E860 Dehydration: Secondary | ICD-10-CM | POA: Diagnosis present

## 2020-12-25 DIAGNOSIS — H9190 Unspecified hearing loss, unspecified ear: Secondary | ICD-10-CM | POA: Diagnosis present

## 2020-12-25 DIAGNOSIS — R6520 Severe sepsis without septic shock: Secondary | ICD-10-CM | POA: Diagnosis present

## 2020-12-25 DIAGNOSIS — E785 Hyperlipidemia, unspecified: Secondary | ICD-10-CM | POA: Diagnosis present

## 2020-12-25 DIAGNOSIS — Z66 Do not resuscitate: Secondary | ICD-10-CM | POA: Diagnosis present

## 2020-12-25 DIAGNOSIS — N1831 Chronic kidney disease, stage 3a: Secondary | ICD-10-CM | POA: Diagnosis present

## 2020-12-25 DIAGNOSIS — E041 Nontoxic single thyroid nodule: Secondary | ICD-10-CM | POA: Diagnosis present

## 2020-12-25 DIAGNOSIS — I129 Hypertensive chronic kidney disease with stage 1 through stage 4 chronic kidney disease, or unspecified chronic kidney disease: Secondary | ICD-10-CM | POA: Diagnosis present

## 2020-12-25 DIAGNOSIS — M810 Age-related osteoporosis without current pathological fracture: Secondary | ICD-10-CM | POA: Diagnosis present

## 2020-12-25 DIAGNOSIS — Z885 Allergy status to narcotic agent status: Secondary | ICD-10-CM

## 2020-12-25 DIAGNOSIS — Z96651 Presence of right artificial knee joint: Secondary | ICD-10-CM | POA: Diagnosis present

## 2020-12-25 DIAGNOSIS — Z20822 Contact with and (suspected) exposure to covid-19: Secondary | ICD-10-CM | POA: Diagnosis present

## 2020-12-25 DIAGNOSIS — Z8616 Personal history of COVID-19: Secondary | ICD-10-CM | POA: Diagnosis not present

## 2020-12-25 DIAGNOSIS — Z79899 Other long term (current) drug therapy: Secondary | ICD-10-CM

## 2020-12-25 DIAGNOSIS — E538 Deficiency of other specified B group vitamins: Secondary | ICD-10-CM | POA: Diagnosis present

## 2020-12-25 DIAGNOSIS — A4150 Gram-negative sepsis, unspecified: Secondary | ICD-10-CM | POA: Diagnosis not present

## 2020-12-25 DIAGNOSIS — I4891 Unspecified atrial fibrillation: Secondary | ICD-10-CM

## 2020-12-25 DIAGNOSIS — I480 Paroxysmal atrial fibrillation: Secondary | ICD-10-CM | POA: Diagnosis present

## 2020-12-25 DIAGNOSIS — M1990 Unspecified osteoarthritis, unspecified site: Secondary | ICD-10-CM | POA: Diagnosis present

## 2020-12-25 DIAGNOSIS — Z809 Family history of malignant neoplasm, unspecified: Secondary | ICD-10-CM

## 2020-12-25 DIAGNOSIS — Y92009 Unspecified place in unspecified non-institutional (private) residence as the place of occurrence of the external cause: Secondary | ICD-10-CM

## 2020-12-25 DIAGNOSIS — A4151 Sepsis due to Escherichia coli [E. coli]: Secondary | ICD-10-CM | POA: Diagnosis not present

## 2020-12-25 DIAGNOSIS — W19XXXA Unspecified fall, initial encounter: Secondary | ICD-10-CM

## 2020-12-25 DIAGNOSIS — Z7982 Long term (current) use of aspirin: Secondary | ICD-10-CM

## 2020-12-25 DIAGNOSIS — N390 Urinary tract infection, site not specified: Secondary | ICD-10-CM | POA: Diagnosis present

## 2020-12-25 DIAGNOSIS — R296 Repeated falls: Secondary | ICD-10-CM | POA: Diagnosis present

## 2020-12-25 DIAGNOSIS — Z96611 Presence of right artificial shoulder joint: Secondary | ICD-10-CM | POA: Diagnosis present

## 2020-12-25 LAB — URINALYSIS, COMPLETE (UACMP) WITH MICROSCOPIC
Bilirubin Urine: NEGATIVE
Glucose, UA: NEGATIVE mg/dL
Ketones, ur: NEGATIVE mg/dL
Nitrite: NEGATIVE
Protein, ur: 100 mg/dL — AB
Specific Gravity, Urine: 1.02 (ref 1.005–1.030)
WBC, UA: >50 WBC/hpf — ABNORMAL HIGH (ref 0–5)
pH: 7 (ref 5.0–8.0)

## 2020-12-25 LAB — COMPREHENSIVE METABOLIC PANEL
ALT: 20 U/L (ref 0–44)
AST: 16 U/L (ref 15–41)
Albumin: 2.6 g/dL — ABNORMAL LOW (ref 3.5–5.0)
Alkaline Phosphatase: 31 U/L — ABNORMAL LOW (ref 38–126)
Anion gap: 11 (ref 5–15)
BUN: 25 mg/dL — ABNORMAL HIGH (ref 8–23)
CO2: 28 mmol/L (ref 22–32)
Calcium: 8 mg/dL — ABNORMAL LOW (ref 8.9–10.3)
Chloride: 100 mmol/L (ref 98–111)
Creatinine, Ser: 1.25 mg/dL — ABNORMAL HIGH (ref 0.44–1.00)
GFR, Estimated: 43 mL/min — ABNORMAL LOW (ref >60)
Glucose, Bld: 198 mg/dL — ABNORMAL HIGH (ref 70–99)
Potassium: 3.6 mmol/L (ref 3.5–5.1)
Sodium: 139 mmol/L (ref 135–145)
Total Bilirubin: 1.9 mg/dL — ABNORMAL HIGH (ref 0.3–1.2)
Total Protein: 4.8 g/dL — ABNORMAL LOW (ref 6.5–8.1)

## 2020-12-25 LAB — CBC
HCT: 34.8 % — ABNORMAL LOW (ref 36.0–46.0)
Hemoglobin: 11.2 g/dL — ABNORMAL LOW (ref 12.0–15.0)
MCH: 27.9 pg (ref 26.0–34.0)
MCHC: 32.2 g/dL (ref 30.0–36.0)
MCV: 86.6 fL (ref 80.0–100.0)
Platelets: 315 10*3/uL (ref 150–400)
RBC: 4.02 MIL/uL (ref 3.87–5.11)
RDW: 17.5 % — ABNORMAL HIGH (ref 11.5–15.5)
WBC: 24.5 10*3/uL — ABNORMAL HIGH (ref 4.0–10.5)
nRBC: 0 % (ref 0.0–0.2)

## 2020-12-25 LAB — GLUCOSE, CAPILLARY: Glucose-Capillary: 132 mg/dL — ABNORMAL HIGH (ref 70–99)

## 2020-12-25 LAB — HEMOGLOBIN A1C
Hgb A1c MFr Bld: 6.3 % — ABNORMAL HIGH (ref 4.8–5.6)
Mean Plasma Glucose: 134.11 mg/dL

## 2020-12-25 LAB — CBG MONITORING, ED: Glucose-Capillary: 118 mg/dL — ABNORMAL HIGH (ref 70–99)

## 2020-12-25 LAB — LACTIC ACID, PLASMA
Lactic Acid, Venous: 2.8 mmol/L (ref 0.5–1.9)
Lactic Acid, Venous: 1.3 mmol/L (ref 0.5–1.9)

## 2020-12-25 LAB — TROPONIN I (HIGH SENSITIVITY)
Troponin I (High Sensitivity): 21 ng/L — ABNORMAL HIGH (ref <18)
Troponin I (High Sensitivity): 21 ng/L — ABNORMAL HIGH (ref <18)

## 2020-12-25 MED ORDER — SODIUM CHLORIDE 0.9 % IV SOLN
2.0000 g | INTRAVENOUS | Status: DC
  Administered 2020-12-25: 2 g via INTRAVENOUS
  Filled 2020-12-25: qty 20

## 2020-12-25 MED ORDER — ACETAMINOPHEN 325 MG PO TABS
650.0000 mg | ORAL_TABLET | Freq: Four times a day (QID) | ORAL | Status: DC | PRN
  Administered 2020-12-26: 16:00:00 650 mg via ORAL
  Filled 2020-12-25: qty 2

## 2020-12-25 MED ORDER — DULOXETINE HCL 30 MG PO CPEP
60.0000 mg | ORAL_CAPSULE | Freq: Every day | ORAL | Status: DC
  Administered 2020-12-26 – 2020-12-28 (×3): 60 mg via ORAL
  Filled 2020-12-25 (×3): qty 2

## 2020-12-25 MED ORDER — ACETAMINOPHEN 650 MG RE SUPP: 650.0000 mg | Freq: Four times a day (QID) | RECTAL | Status: DC | PRN

## 2020-12-25 MED ORDER — ASPIRIN EC 81 MG PO TBEC
81.0000 mg | DELAYED_RELEASE_TABLET | Freq: Every day | ORAL | Status: DC
  Administered 2020-12-26 – 2020-12-28 (×3): 81 mg via ORAL
  Filled 2020-12-25 (×3): qty 1

## 2020-12-25 MED ORDER — POTASSIUM CHLORIDE IN NACL 20-0.9 MEQ/L-% IV SOLN
INTRAVENOUS | Status: DC
  Filled 2020-12-25 (×4): qty 1000

## 2020-12-25 MED ORDER — CALCIUM CARBONATE-VITAMIN D 500-200 MG-UNIT PO TABS
ORAL_TABLET | Freq: Every day | ORAL | Status: DC
  Administered 2020-12-26 – 2020-12-28 (×3): 1 via ORAL
  Filled 2020-12-25 (×3): qty 1

## 2020-12-25 MED ORDER — PANTOPRAZOLE SODIUM 40 MG PO TBEC
40.0000 mg | DELAYED_RELEASE_TABLET | Freq: Two times a day (BID) | ORAL | Status: DC
  Administered 2020-12-25 – 2020-12-28 (×6): 40 mg via ORAL
  Filled 2020-12-25 (×6): qty 1

## 2020-12-25 MED ORDER — TRAZODONE HCL 50 MG PO TABS
25.0000 mg | ORAL_TABLET | Freq: Every evening | ORAL | Status: DC | PRN
  Administered 2020-12-26: 21:00:00 25 mg via ORAL
  Filled 2020-12-25: qty 1

## 2020-12-25 MED ORDER — CEFTRIAXONE SODIUM 2 G IJ SOLR
2.0000 g | INTRAMUSCULAR | Status: DC
  Administered 2020-12-26 – 2020-12-27 (×2): 2 g via INTRAVENOUS
  Filled 2020-12-25 (×3): qty 20

## 2020-12-25 MED ORDER — INSULIN ASPART 100 UNIT/ML IJ SOLN
0.0000 [IU] | Freq: Three times a day (TID) | INTRAMUSCULAR | Status: DC
  Administered 2020-12-25: 23:00:00 1 [IU] via SUBCUTANEOUS
  Administered 2020-12-26 (×2): 2 [IU] via SUBCUTANEOUS
  Administered 2020-12-26 – 2020-12-27 (×2): 1 [IU] via SUBCUTANEOUS
  Administered 2020-12-27: 13:00:00 2 [IU] via SUBCUTANEOUS
  Administered 2020-12-27: 22:00:00 1 [IU] via SUBCUTANEOUS
  Filled 2020-12-25 (×7): qty 1

## 2020-12-25 MED ORDER — LORATADINE 10 MG PO TABS
10.0000 mg | ORAL_TABLET | Freq: Every day | ORAL | Status: DC
  Administered 2020-12-26 – 2020-12-28 (×3): 10 mg via ORAL
  Filled 2020-12-25 (×3): qty 1

## 2020-12-25 MED ORDER — DILTIAZEM HCL ER COATED BEADS 240 MG PO CP24
240.0000 mg | ORAL_CAPSULE | Freq: Every day | ORAL | Status: DC
  Administered 2020-12-26 – 2020-12-28 (×3): 240 mg via ORAL
  Filled 2020-12-25 (×3): qty 1

## 2020-12-25 MED ORDER — VITAMIN B-12 1000 MCG PO TABS
1000.0000 ug | ORAL_TABLET | Freq: Every day | ORAL | Status: DC
  Administered 2020-12-26 – 2020-12-28 (×3): 1000 ug via ORAL
  Filled 2020-12-25 (×3): qty 1

## 2020-12-25 MED ORDER — ONDANSETRON HCL 4 MG PO TABS: 4.0000 mg | ORAL_TABLET | Freq: Four times a day (QID) | ORAL | Status: DC | PRN

## 2020-12-25 MED ORDER — ONDANSETRON HCL 4 MG/2ML IJ SOLN: 4.0000 mg | Freq: Four times a day (QID) | INTRAMUSCULAR | Status: DC | PRN

## 2020-12-25 MED ORDER — MAGNESIUM HYDROXIDE 400 MG/5ML PO SUSP
30.0000 mL | Freq: Every day | ORAL | Status: DC | PRN
  Filled 2020-12-25: qty 30

## 2020-12-25 MED ORDER — SIMVASTATIN 20 MG PO TABS
40.0000 mg | ORAL_TABLET | Freq: Every day | ORAL | Status: DC
  Administered 2020-12-25 – 2020-12-26 (×2): 40 mg via ORAL
  Filled 2020-12-25 (×2): qty 2

## 2020-12-25 MED ORDER — APIXABAN 5 MG PO TABS
5.0000 mg | ORAL_TABLET | Freq: Two times a day (BID) | ORAL | Status: DC
  Administered 2020-12-25 – 2020-12-28 (×6): 5 mg via ORAL
  Filled 2020-12-25 (×6): qty 1

## 2020-12-25 MED ORDER — TRAMADOL HCL 50 MG PO TABS
50.0000 mg | ORAL_TABLET | Freq: Three times a day (TID) | ORAL | Status: DC | PRN
  Administered 2020-12-25 – 2020-12-27 (×2): 100 mg via ORAL
  Filled 2020-12-25 (×2): qty 2

## 2020-12-25 MED ORDER — SODIUM CHLORIDE 0.9% FLUSH
10.0000 mL | Freq: Two times a day (BID) | INTRAVENOUS | Status: DC
  Administered 2020-12-25 – 2020-12-27 (×4): 10 mL via INTRAVENOUS

## 2020-12-25 MED ORDER — METOPROLOL SUCCINATE ER 50 MG PO TB24
50.0000 mg | ORAL_TABLET | Freq: Two times a day (BID) | ORAL | Status: DC
  Administered 2020-12-25 – 2020-12-28 (×6): 50 mg via ORAL
  Filled 2020-12-25 (×6): qty 1

## 2020-12-25 MED ORDER — LACTATED RINGERS IV SOLN: INTRAVENOUS | Status: DC

## 2020-12-25 NOTE — Progress Notes (Signed)
CODE SEPSIS - PHARMACY COMMUNICATION  **Broad Spectrum Antibiotics should be administered within 1 hour of Sepsis diagnosis**  Time Code Sepsis Called/Page Received: 1411  Antibiotics Ordered: Ceftriaxone  Time of 1st antibiotic administration: 1418  Additional action taken by pharmacy: N/A  Tressie Ellis 12/25/2020  2:19 PM

## 2020-12-25 NOTE — Sepsis Progress Note (Signed)
Sepsis protocol is being monitored by eLink. 

## 2020-12-25 NOTE — ED Provider Notes (Signed)
[facility] Emergency Department Provider Note   ____________________________________________    I have reviewed the triage vital signs and the nursing notes.   HISTORY  Chief Complaint Weakness     HPI Briana Cook is a 86 y.o. female who presents after a fall.  Patient with a history of several falls over the last 2 to 3 days.  She does have a history of diabetes and atrial fibrillation apparently on a blood thinner as well.  She thinks that she may have passed out at home today.  She denies chest pain or palpitations.  No nausea or vomiting.  No extremity injuries.  She may have hit her head she is not sure.  No back pain.  No abdominal pain  Past Medical History:  Diagnosis Date   Arthritis    Cough    Depression    Diabetes (HCC)    Environmental and seasonal allergies    GERD (gastroesophageal reflux disease)    HLD (hyperlipidemia)    HOH (hard of hearing)    Hypertension    Lower extremity edema    Osteoporosis    Palpitations    Sinus congestion    Wheezing     Patient Active Problem List   Diagnosis Date Noted   Acute hypoxemic respiratory failure due to COVID-19 (HCC) 12/13/2020   Atrial fibrillation with RVR (HCC) 12/12/2020   CKD stage 3 due to type 2 diabetes mellitus (HCC) 12/12/2020   Depression    COVID-19 virus detected    CAP (community acquired pneumonia) 04/06/2018   HTN (hypertension) 04/06/2018   Diabetes (HCC) 04/06/2018   HLD (hyperlipidemia) 04/06/2018   GERD (gastroesophageal reflux disease) 04/06/2018   Viral enteritis 03/01/2016   Hypotension 03/01/2016   Acute renal failure (ARF) (HCC) 03/01/2016   Acidosis 03/01/2016   UTI (urinary tract infection) 03/01/2016   Viral gastroenteritis 03/01/2016    Past Surgical History:  Procedure Laterality Date   BREAST BIOPSY     x2   CATARACT EXTRACTION W/PHACO Right 01/23/2019   Procedure: CATARACT EXTRACTION PHACO AND INTRAOCULAR LENS PLACEMENT (IOC)  RIGHT, MALYUGIN;  Surgeon: Elliot Cousin, MD;  Location: [facility] ORS;  Service: Ophthalmology;  Laterality: Right;  [place]  01:33 CDE 13.28 Fluid pack lot # [id number]   CATARACT EXTRACTION W/PHACO Left 02/13/2019   Procedure: CATARACT EXTRACTION PHACO AND INTRAOCULAR LENS PLACEMENT (IOC) LEFT VISION BLUE;  Surgeon: Elliot Cousin, MD;  Location: [facility] ORS;  Service: Ophthalmology;  Laterality: Left;  Lot # [id number] [place]: 01:10.5 CDE: 11.53   CHOLECYSTECTOMY     REPLACEMENT TOTAL KNEE Right    TOTAL SHOULDER REPLACEMENT Right     Prior to Admission medications   Medication Sig Start Date End Date Taking? Authorizing Provider  acetaminophen (TYLENOL) 500 MG tablet Take 1,000 mg by mouth every 8 (eight) hours as needed.    [provider]  apixaban (ELIQUIS) 5 MG TABS tablet Take 1 tablet (5 mg total) by mouth 2 (two) times daily. 12/14/20   Marrion Coy, MD  aspirin EC 81 MG tablet Take 81 mg by mouth daily.    [provider]  Calcium Carbonate-Vitamin D (CALCIUM 600+D PO) Take 2 tablets by mouth daily.    [provider]  cetirizine (ZYRTEC) 10 MG tablet Take 10 mg by mouth daily as needed.    [provider]  diltiazem (CARDIZEM CD) 240 MG 24 hr capsule Take 1 capsule (240 mg total) by mouth daily. 12/15/20   Marrion Coy,  MD  DULoxetine (CYMBALTA) 60 MG capsule Take 60 mg by mouth daily. 03/10/18   [provider]  Menthol (ICY HOT) 5 % PTCH Apply 1 patch topically daily as needed (pain).    [provider]  Menthol, Topical Analgesic, (ICY HOT EX) Apply 1 application topically daily as needed (pain).    [provider]  metoprolol succinate (TOPROL-XL) 50 MG 24 hr tablet Take 50 mg by mouth 2 (two) times daily. 12/08/20   [provider]  pantoprazole (PROTONIX) 40 MG tablet Take 40 mg by mouth 2 (two) times daily before a meal. 11/26/20   [provider]  simvastatin (ZOCOR) 40 MG tablet Take 40 mg by mouth daily.  02/20/16    [provider]  traMADol (ULTRAM) 50 MG tablet Take 50-100 mg by mouth every 8 (eight) hours as needed for moderate pain.  01/26/16   [provider]  vitamin B-12 (CYANOCOBALAMIN) 1000 MCG tablet Take 1,000 mcg by mouth daily.    [provider]     Allergies Oxycodone  Family History  Problem Relation Age of Onset   Hypertension Mother    Cancer Father    Cancer Sister    Cancer Brother     Social History Social History   Tobacco Use   Smoking status: Never   Smokeless tobacco: Never  Substance Use Topics   Alcohol use: Yes    Comment: occasionally   Drug use: No    Review of Systems  Constitutional: No fever/chills Eyes: No visual changes.  ENT: No sore throat. Cardiovascular: Denies chest pain. Respiratory: Denies shortness of breath. Gastrointestinal: No abdominal pain.  No nausea, no vomiting.   Genitourinary: Negative for dysuria. Musculoskeletal: Negative for back pain. Skin: Negative for rash. Neurological: Negative for headaches   ____________________________________________   PHYSICAL EXAM:  VITAL SIGNS: ED Triage Vitals  Enc Vitals Group     BP 12/25/20 1034 133/84     Pulse Rate 12/25/20 1057 93     Resp 12/25/20 1034 (!) 23     Temp 12/25/20 1034 97.8 F (36.6 C)     Temp Source 12/25/20 1034 Oral     SpO2 12/25/20 1057 96 %     Weight --      Height --      Head Circumference --      Peak Flow --      Pain Score --      Pain Loc --      Pain Edu? --      Excl. in GC? --     Constitutional: Alert and oriented. No acute distress. Pleasant and interactive Eyes: Conjunctivae are normal.  Head: Bruises to the forehead bilaterally, unclear age Nose: No congestion/rhinnorhea. Mouth/Throat: Mucous membranes are moist.   Neck:  Painless ROM, no pain with axial load Cardiovascular: Normal rate, regular rhythm. Grossly normal heart sounds.  Good peripheral circulation.  Chest wall tenderness to  palpation Respiratory: Normal respiratory effort.  No retractions. Lungs CTAB. Gastrointestinal: Soft and nontender. No distention.    Musculoskeletal: No lower extremity tenderness nor edema.  Warm and well perfused Neurologic:  Normal speech and language. No gross focal neurologic deficits are appreciated.  Skin:  Skin is warm, dry and intact. No rash noted. Psychiatric: Mood and affect are normal. Speech and behavior are normal.  ____________________________________________   LABS (all labs ordered are listed, but only abnormal results are displayed)  Labs Reviewed  CBC - Abnormal; Notable for the following components:  Result Value   WBC 24.5 (*)    Hemoglobin 11.2 (*)    HCT 34.8 (*)    RDW 17.5 (*)    All other components within normal limits  COMPREHENSIVE METABOLIC PANEL - Abnormal; Notable for the following components:   Glucose, Bld 198 (*)    BUN 25 (*)    Creatinine, Ser 1.25 (*)    Calcium 8.0 (*)    Total Protein 4.8 (*)    Albumin 2.6 (*)    Alkaline Phosphatase 31 (*)    Total Bilirubin 1.9 (*)    GFR, Estimated 43 (*)    All other components within normal limits  URINALYSIS, COMPLETE (UACMP) WITH MICROSCOPIC - Abnormal; Notable for the following components:   APPearance HAZY (*)    Hgb urine dipstick SMALL (*)    Protein, ur 100 (*)    Leukocytes,Ua SMALL (*)    WBC, UA >50 (*)    Bacteria, UA MANY (*)    All other components within normal limits  LACTIC ACID, PLASMA - Abnormal; Notable for the following components:   Lactic Acid, Venous 2.8 (*)    All other components within normal limits  TROPONIN I (HIGH SENSITIVITY) - Abnormal; Notable for the following components:   Troponin I (High Sensitivity) 21 (*)    All other components within normal limits  TROPONIN I (HIGH SENSITIVITY) - Abnormal; Notable for the following components:   Troponin I (High Sensitivity) 21 (*)    All other components within normal limits  CULTURE, BLOOD (ROUTINE X 2)   CULTURE, BLOOD (ROUTINE X 2)  URINE CULTURE  LACTIC ACID, PLASMA   ____________________________________________  EKG  ED ECG REPORT I, Jene Every, the attending physician, personally viewed and interpreted this ECG.  Date: 12/25/2020  Rhythm: Atrial fibrillation QRS Axis: normal Intervals: n abnormal ST/T Wave abnormalities: normal Narrative Interpretation: no evidence of acute ischemia  ____________________________________________  RADIOLOGY  Chest x-ray viewed by me, no acute abnormality ____________________________________________   PROCEDURES  Procedure(s) performed: No  Procedures   Critical Care performed: No ____________________________________________   INITIAL IMPRESSION / ASSESSMENT AND PLAN / ED COURSE  Pertinent labs & imaging results that were available during my care of the patient were reviewed by me and considered in my medical decision making (see chart for details).   Patient presents after fall, possible syncope.  She does have a history of atrial fibrillation reportedly on blood thinners.  Bruising to the forehead bilaterally however appears at least a couple of days old.  We will send for CT head cervical spine, obtain labs including chest x-ray troponin  Troponin mildly elevated at 21, this is not significantly elevated over prior.  EKG is generally reassuring.  Work notable for white blood cell count of 24,000, no recent steroids apparently  Lactic acid is elevated however no source, chest x-ray reassuring, patient afebrile.  Pending urinalysis  ----------------------------------------- 2:03 PM on 12/25/2020 ----------------------------------------- Urinalysis is consistent with urinary tract infection.  This could be a source for sepsis, sepsis recognized at this time.  IV Rocephin ordered, will consult the hospitalist    ____________________________________________   FINAL CLINICAL IMPRESSION(S) / ED DIAGNOSES  Final  diagnoses:  Lower urinary tract infectious disease  Sepsis without acute organ dysfunction, due to unspecified organism [facility])        Note:  This document was prepared using Dragon voice recognition software and may include unintentional dictation errors.    Jene Every, MD 12/25/20 [phone]

## 2020-12-25 NOTE — ED Notes (Signed)
Pt arrived wet and with dried feces on her bottom. Pt has bruising to bilateral sides of forehead, right flank, right hip, small bruises throughout legs.

## 2020-12-25 NOTE — ED Notes (Signed)
RN aware of bed assignment 

## 2020-12-25 NOTE — ED Notes (Signed)
Provided update to Florentina Addison, granddaughter

## 2020-12-25 NOTE — ED Notes (Signed)
Spoke with granddaughter Florentina Addison to give update

## 2020-12-25 NOTE — H&P (Addendum)
[place]   PATIENT NAME: Briana Cook    MR#:  [id number]  DATE OF BIRTH:  1934/11/09  DATE OF ADMISSION:  12/25/2020  PRIMARY CARE PHYSICIAN: Patient, No Pcp Per (Inactive)   Patient is coming from: Home  REQUESTING/REFERRING PHYSICIAN: Jene Every, MD  CHIEF COMPLAINT:   Chief Complaint  Patient presents with  . Weakness    HISTORY OF PRESENT ILLNESS:  Briana Cook is a 86 y.o. female with medical history significant for type II diabetes mellitus with stage IIIa chronic kidney disease, hypertension, depression, GERD and dyslipidemia, presenting to the emergency room with acute onset of generalized weakness and a fall.  She has been having recurrent falls over the last 2 to 3 days.  She thinks she had a near syncopal episode today with some dizziness and is not sure about head injury.  She likely has head injury as she has a right frontal contusion.  She admits to recent urinary frequency with urgency and dysuria without hematuria or flank pain.  She stated that she would get dizzy when she gets up so fast.  No paresthesias or focal muscle weakness.  No chest pain or palpitations.  No cough or wheezing or dyspnea.  ED Course: When she came to the ER heart rate was 114 and respiratory rate 23 with otherwise normal vital signs.  Labs revealed significant for positive UA for UTI and significant leukocytosis on her CBC with mild anemia.  Lactic acid was 2.8 and high-sensitivity troponin I was 21 and later the same.  CMP was remarkable for a blood glucose of 198 and BUN of 25 with creatinine 1.25 compared to 19/1.03 on 12/14/2020, total bili of 1.9 with low albumin of 2.6 and total protein of 4.8. EKG as reviewed by me : EKG showed atrial fibrillation with rapid ventricular response of 109 with Q waves inferolaterally. Imaging: Portable chest ray showed no acute cardiopulmonary disease.  Noncontrasted CT scan showed right lateral frontal scalp contusion/hematoma with no fracture  or acute intracranial normalities.  C-spine CT showed 1.5 cm left thyroid nodule with recommendation for thyroid ultrasound and normal fracture or acute findings.  Thyroid  The patient was given 2 g of IV Rocephin and hydration with IV lactated Ringer at 150 mill per hour.  She will be admitted to a medical monitored bed for further evaluation and management. PAST MEDICAL HISTORY:   Past Medical History:  Diagnosis Date  . Arthritis   . Cough   . Depression   . Diabetes (HCC)   . Environmental and seasonal allergies   . GERD (gastroesophageal reflux disease)   . HLD (hyperlipidemia)   . HOH (hard of hearing)   . Hypertension   . Lower extremity edema   . Osteoporosis   . Palpitations   . Sinus congestion   . Wheezing     PAST SURGICAL HISTORY:   Past Surgical History:  Procedure Laterality Date  . BREAST BIOPSY     x2  . CATARACT EXTRACTION W/PHACO Right 01/23/2019   Procedure: CATARACT EXTRACTION PHACO AND INTRAOCULAR LENS PLACEMENT (IOC) RIGHT, MALYUGIN;  Surgeon: Elliot Cousin, MD;  Location: [facility] ORS;  Service: Ophthalmology;  Laterality: Right;  [place]  01:33 CDE 13.28 Fluid pack lot # [id number]  . CATARACT EXTRACTION W/PHACO Left 02/13/2019   Procedure: CATARACT EXTRACTION PHACO AND INTRAOCULAR LENS PLACEMENT (IOC) LEFT VISION BLUE;  Surgeon: Elliot Cousin, MD;  Location: [facility] ORS;  Service: Ophthalmology;  Laterality: Left;  Lot # [id number]  [place]: 01:10.5 CDE: 11.53  . CHOLECYSTECTOMY    . REPLACEMENT TOTAL KNEE Right   . TOTAL SHOULDER REPLACEMENT Right     SOCIAL HISTORY:   Social History   Tobacco Use  . Smoking status: Never  . Smokeless tobacco: Never  Substance Use Topics  . Alcohol use: Yes    Comment: occasionally    FAMILY HISTORY:   Family History  Problem Relation Age of Onset  . Hypertension Mother   . Cancer Father   . Cancer Sister   . Cancer Brother     DRUG ALLERGIES:   Allergies  Allergen Reactions  . Oxycodone Other (See Comments)     Pt reports "It makes me crazy" Other reaction(s): Hallucination    REVIEW OF SYSTEMS:   ROS As per history of present illness. All pertinent systems were reviewed above. Constitutional, HEENT, cardiovascular, respiratory, GI, GU, musculoskeletal, neuro, psychiatric, endocrine, integumentary and hematologic systems were reviewed and are otherwise negative/unremarkable except for positive findings mentioned above in the HPI.   MEDICATIONS AT HOME:   Prior to Admission medications   Medication Sig Start Date End Date Taking? Authorizing Provider  acetaminophen (TYLENOL) 500 MG tablet Take 1,000 mg by mouth every 8 (eight) hours as needed.    [provider]  apixaban (ELIQUIS) 5 MG TABS tablet Take 1 tablet (5 mg total) by mouth 2 (two) times daily. 12/14/20   Marrion Coy, MD  aspirin EC 81 MG tablet Take 81 mg by mouth daily.    [provider]  Calcium Carbonate-Vitamin D (CALCIUM 600+D PO) Take 2 tablets by mouth daily.    [provider]  cetirizine (ZYRTEC) 10 MG tablet Take 10 mg by mouth daily as needed.    [provider]  diltiazem (CARDIZEM CD) 240 MG 24 hr capsule Take 1 capsule (240 mg total) by mouth daily. 12/15/20   Marrion Coy, MD  DULoxetine (CYMBALTA) 60 MG capsule Take 60 mg by mouth daily. 03/10/18   [provider]  Menthol (ICY HOT) 5 % PTCH Apply 1 patch topically daily as needed (pain).    [provider]  Menthol, Topical Analgesic, (ICY HOT EX) Apply 1 application topically daily as needed (pain).    [provider]  metoprolol succinate (TOPROL-XL) 50 MG 24 hr tablet Take 50 mg by mouth 2 (two) times daily. 12/08/20   [provider]  pantoprazole (PROTONIX) 40 MG tablet Take 40 mg by mouth 2 (two) times daily before a meal. 11/26/20   [provider]  simvastatin (ZOCOR) 40 MG tablet Take 40 mg by mouth daily.  02/20/16   [provider]  traMADol (ULTRAM) 50 MG tablet Take 50-100  mg by mouth every 8 (eight) hours as needed for moderate pain.  01/26/16   [provider]  vitamin B-12 (CYANOCOBALAMIN) 1000 MCG tablet Take 1,000 mcg by mouth daily.    [provider]      VITAL SIGNS:  Blood pressure 124/87, pulse 87, temperature 97.8 F (36.6 C), temperature source Oral, resp. rate 16, SpO2 98 %.  PHYSICAL EXAMINATION:  Physical Exam  GENERAL:  86 y.o.-year-old female patient lying in the bed with no acute distress.  EYES: Pupils equal, round, reactive to light and accommodation. No scleral icterus. Extraocular muscles intact.  HEENT: Head with right frontal contusion with bluish and yellowish discoloration, normocephalic. Oropharynx and nasopharynx clear.  NECK:  Supple, no jugular venous distention. No thyroid enlargement, no tenderness.  LUNGS: Normal breath sounds bilaterally,  no wheezing, rales,rhonchi or crepitation. No use of accessory muscles of respiration.  CARDIOVASCULAR: Irregularly irregular rhythm, S1, S2 normal. No murmurs, rubs, or gallops.  ABDOMEN: Soft, nondistended, nontender. Bowel sounds present. No organomegaly or mass.  EXTREMITIES: No pedal edema, cyanosis, or clubbing.  NEUROLOGIC: Cranial nerves II through XII are intact. Muscle strength 5/5 in all extremities. Sensation intact. Gait not checked.  PSYCHIATRIC: The patient is alert and oriented x 3.  Normal affect and good eye contact. SKIN: No obvious rash, lesion, or ulcer.   LABORATORY PANEL:   CBC Recent Labs  Lab 12/25/20 1053  WBC 24.5*  HGB 11.2*  HCT 34.8*  PLT 315   ------------------------------------------------------------------------------------------------------------------  Chemistries  Recent Labs  Lab 12/25/20 1053  NA 139  K 3.6  CL 100  CO2 28  GLUCOSE 198*  BUN 25*  CREATININE 1.25*  CALCIUM 8.0*  AST 16  ALT 20  ALKPHOS 31*  BILITOT 1.9*    ------------------------------------------------------------------------------------------------------------------  Cardiac Enzymes No results for input(s): TROPONINI in the last 168 hours. ------------------------------------------------------------------------------------------------------------------  RADIOLOGY:  CT HEAD WO CONTRAST ( )  Result Date: 12/25/2020 CLINICAL DATA:  Dizziness last night. Patient fell. Bruising to both temporal areas. EXAM: CT HEAD WITHOUT CONTRAST CT CERVICAL SPINE WITHOUT CONTRAST TECHNIQUE: Multidetector CT imaging of the head and cervical spine was performed following the standard protocol without intravenous contrast. Multiplanar CT image reconstructions of the cervical spine were also generated. COMPARISON:  None. FINDINGS: CT HEAD FINDINGS Brain: No evidence of acute infarction, hemorrhage, hydrocephalus, extra-axial collection or mass lesion/mass effect. Patchy areas of white matter hypoattenuation are noted consistent with mild chronic microvascular ischemic change. Vascular: No hyperdense vessel or unexpected calcification. Skull: Normal. Negative for fracture or focal lesion. Sinuses/Orbits: Globes and orbits are unremarkable. Sinuses are clear. Other: Right lateral frontal scalp contusion/hematoma. CT CERVICAL SPINE FINDINGS Alignment: Normal. Skull base and vertebrae: No acute fracture. No primary bone lesion or focal pathologic process. Soft tissues and spinal canal: No prevertebral fluid or swelling. No visible canal hematoma. Disc levels: Mild loss of disc height at C5-C6 and C6-C7. Mild facet degenerative change on the right at C2-C3 and C3-C4 and on the left at C3-C4. No disc herniation or significant stenosis. Upper chest: No acute findings.  1.5 cm left thyroid nodule. Other: None. IMPRESSION: HEAD CT 1. No acute intracranial abnormalities. 2. Right lateral frontal scalp contusion/hematoma. No skull fracture. CERVICAL CT 1. No fracture or acute  finding. 2. 1.5 cm left thyroid nodule. Recommend thyroid US (ref: J Am Coll Radiol. 2015 Feb;12(2): 143-50). Electronically Signed   By: Amie Portland M.D.   On: 12/25/2020 12:08   CT Cervical Spine Wo Contrast  Result Date: 12/25/2020 CLINICAL DATA:  Dizziness last night. Patient fell. Bruising to both temporal areas. EXAM: CT HEAD WITHOUT CONTRAST CT CERVICAL SPINE WITHOUT CONTRAST TECHNIQUE: Multidetector CT imaging of the head and cervical spine was performed following the standard protocol without intravenous contrast. Multiplanar CT image reconstructions of the cervical spine were also generated. COMPARISON:  None. FINDINGS: CT HEAD FINDINGS Brain: No evidence of acute infarction, hemorrhage, hydrocephalus, extra-axial collection or mass lesion/mass effect. Patchy areas of white matter hypoattenuation are noted consistent with mild chronic microvascular ischemic change. Vascular: No hyperdense vessel or unexpected calcification. Skull: Normal. Negative for fracture or focal lesion. Sinuses/Orbits: Globes and orbits are unremarkable. Sinuses are clear. Other: Right lateral frontal scalp contusion/hematoma. CT CERVICAL SPINE FINDINGS Alignment: Normal. Skull base and vertebrae: No acute fracture. No primary bone lesion or focal pathologic  process. Soft tissues and spinal canal: No prevertebral fluid or swelling. No visible canal hematoma. Disc levels: Mild loss of disc height at C5-C6 and C6-C7. Mild facet degenerative change on the right at C2-C3 and C3-C4 and on the left at C3-C4. No disc herniation or significant stenosis. Upper chest: No acute findings.  1.5 cm left thyroid nodule. Other: None. IMPRESSION: HEAD CT 1. No acute intracranial abnormalities. 2. Right lateral frontal scalp contusion/hematoma. No skull fracture. CERVICAL CT 1. No fracture or acute finding. 2. 1.5 cm left thyroid nodule. Recommend thyroid US (ref: J Am Coll Radiol. 2015 Feb;12(2): 143-50). Electronically Signed   By: Amie Portland M.D.   On: 12/25/2020 12:08   DG Chest Port 1 View  Result Date: 12/25/2020 CLINICAL DATA:  Weakness EXAM: PORTABLE CHEST 1 VIEW COMPARISON:  Thirteen days ago FINDINGS: Chronic cardiomegaly. Improved aeration. No consolidation, edema, effusion, or pneumothorax. 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 Extensive artifact from EKG leads. IMPRESSION: No evidence of active disease. Electronically Signed   By: Marnee Spring M.D.   On: 12/25/2020 11:10      IMPRESSION AND PLAN:  Active Problems:   Sepsis due to gram-negative UTI (HCC)  1.  Sepsis secondary to UTI.  Sepsis is manifested by leukocytosis and tachycardia as well as tachypnea.  The patient meets severe sepsis criteria given lactic acid of 2.8 and mild AKI. - The patient will be admitted to a medical monitored bed. - We will continue hydration with IV normal saline with added potassium chloride. - We will continue antibiotic therapy with IV Rocephin. - We will follow blood and urine cultures. - We will follow lactic acid level.  2.  Mild acute kidney injury, likely prerenal due to volume depletion and dehydration, superimposed on stage IIIa chronic kidney disease. - The patient will be hydrated with IV normal saline as mentioned above and will follow BMP.  3.  Atrial fibrillation with rapid ventricular response. - We will continue Cardizem CD and cautiously give Eliquis. - Heart rate is gotten better with hydration. - Anticoagulation may need to be further discussed with the patient was thought to be high fall risk by PT.  4.  Recurrent falls and suspected pre- syncope. - This likely multifactorial due to UTI and sepsis, possible dehydration and atrial fibrillation with RVR -We will monitor orthostatics and obtain physical therapy consultation. - We will hydrate as mentioned above and monitor for arrhythmias.  5.  Dyslipidemia. - We will continue statin therapy.  6.  Essential  hypertension. - We will continue Cardizem CD and Toprol-XL and monitor heart rate.  7.  GERD. - We will continue PPI therapy.  8.  Vitamin B12 deficiency. - We will continue vitamin B12.  9.  Type 2 diabetes mellitus with stage IIIa chronic kidney disease. - The patient will be placed on supplement coverage with NovoLog.  DVT prophylaxis: We will continue Eliquis. Code Status: The patient is DNR/DNI. Family Communication:  The plan of care was discussed in details with the patient (and family). I answered all questions. The patient agreed to proceed with the above mentioned plan. Further management will depend upon hospital course. Disposition Plan: Back to previous home environment Consults called: none. All the records are reviewed and case discussed with ED provider.  Status is: Inpatient  Remains inpatient appropriate because:Ongoing diagnostic testing needed not appropriate for outpatient work up, Unsafe d/c plan, IV treatments appropriate due to intensity of illness or inability to  take PO, and Inpatient level of care appropriate due to severity of illness  Dispo: The patient is from: Home              Anticipated d/c is to: Home              Patient currently is not medically stable to d/c.   Difficult to place patient No   TOTAL TIME TAKING CARE OF THIS PATIENT: 55 minutes.    Hannah BeatJan A Nalia Honeycutt M.D on 12/25/2020 at 2:35 PM  [facility]   From 7 PM-7 AM, contact night-coverage [email or web address]  CC: Primary care physician; Patient, No Pcp Per (Inactive)

## 2020-12-25 NOTE — ED Triage Notes (Signed)
Pt arrives via EMS from home for weakness. Family reports frequent falls. Pt has bruising to head, shoulders, back. HR 170 for EMS and syncopal upon standing.  5 mg metoprolol given at 10:05

## 2020-12-25 NOTE — ED Notes (Signed)
Adjusted pt's bed and cut down lights per request

## 2020-12-25 NOTE — ED Notes (Signed)
Pt requesting additional pillows and hospital bed. Informed pt she will be upstairs shortly, pt okay with waiting until she gets up there. Informed pt her family is bringing her phone. Meal tray at bedside.

## 2020-12-26 ENCOUNTER — Inpatient Hospital Stay: Payer: Medicare Other

## 2020-12-26 DIAGNOSIS — E041 Nontoxic single thyroid nodule: Secondary | ICD-10-CM

## 2020-12-26 DIAGNOSIS — W19XXXA Unspecified fall, initial encounter: Secondary | ICD-10-CM

## 2020-12-26 DIAGNOSIS — I480 Paroxysmal atrial fibrillation: Secondary | ICD-10-CM

## 2020-12-26 DIAGNOSIS — A419 Sepsis, unspecified organism: Secondary | ICD-10-CM

## 2020-12-26 DIAGNOSIS — R6520 Severe sepsis without septic shock: Secondary | ICD-10-CM

## 2020-12-26 LAB — CBC
HCT: 34.5 % — ABNORMAL LOW (ref 36.0–46.0)
Hemoglobin: 10.7 g/dL — ABNORMAL LOW (ref 12.0–15.0)
MCH: 26.9 pg (ref 26.0–34.0)
MCHC: 31 g/dL (ref 30.0–36.0)
MCV: 86.7 fL (ref 80.0–100.0)
Platelets: 303 10*3/uL (ref 150–400)
RBC: 3.98 MIL/uL (ref 3.87–5.11)
RDW: 18 % — ABNORMAL HIGH (ref 11.5–15.5)
WBC: 20.8 10*3/uL — ABNORMAL HIGH (ref 4.0–10.5)
nRBC: 0 % (ref 0.0–0.2)

## 2020-12-26 LAB — BASIC METABOLIC PANEL
Anion gap: 9 (ref 5–15)
BUN: 22 mg/dL (ref 8–23)
CO2: 31 mmol/L (ref 22–32)
Calcium: 8 mg/dL — ABNORMAL LOW (ref 8.9–10.3)
Chloride: 102 mmol/L (ref 98–111)
Creatinine, Ser: 0.91 mg/dL (ref 0.44–1.00)
GFR, Estimated: >60 mL/min (ref >60)
Glucose, Bld: 102 mg/dL — ABNORMAL HIGH (ref 70–99)
Potassium: 4.2 mmol/L (ref 3.5–5.1)
Sodium: 142 mmol/L (ref 135–145)

## 2020-12-26 LAB — CBC WITH DIFFERENTIAL/PLATELET
Abs Immature Granulocytes: 0.37 10*3/uL — ABNORMAL HIGH (ref 0.00–0.07)
Basophils Absolute: 0 10*3/uL (ref 0.0–0.1)
Basophils Relative: 0 %
Eosinophils Absolute: 0.1 10*3/uL (ref 0.0–0.5)
Eosinophils Relative: 0 %
HCT: 33.7 % — ABNORMAL LOW (ref 36.0–46.0)
Hemoglobin: 10.7 g/dL — ABNORMAL LOW (ref 12.0–15.0)
Immature Granulocytes: 2 %
Lymphocytes Relative: 9 %
Lymphs Abs: 1.8 10*3/uL (ref 0.7–4.0)
MCH: 27.7 pg (ref 26.0–34.0)
MCHC: 31.8 g/dL (ref 30.0–36.0)
MCV: 87.3 fL (ref 80.0–100.0)
Monocytes Absolute: 1.6 10*3/uL — ABNORMAL HIGH (ref 0.1–1.0)
Monocytes Relative: 8 %
Neutro Abs: 16.8 10*3/uL — ABNORMAL HIGH (ref 1.7–7.7)
Neutrophils Relative %: 81 %
Platelets: 304 10*3/uL (ref 150–400)
RBC: 3.86 MIL/uL — ABNORMAL LOW (ref 3.87–5.11)
RDW: 18.2 % — ABNORMAL HIGH (ref 11.5–15.5)
WBC: 20.7 10*3/uL — ABNORMAL HIGH (ref 4.0–10.5)
nRBC: 0 % (ref 0.0–0.2)

## 2020-12-26 LAB — PROTIME-INR
INR: 1.7 — ABNORMAL HIGH (ref 0.8–1.2)
Prothrombin Time: 19.7 seconds — ABNORMAL HIGH (ref 11.4–15.2)

## 2020-12-26 LAB — GLUCOSE, CAPILLARY
Glucose-Capillary: 91 mg/dL (ref 70–99)
Glucose-Capillary: 156 mg/dL — ABNORMAL HIGH (ref 70–99)
Glucose-Capillary: 135 mg/dL — ABNORMAL HIGH (ref 70–99)
Glucose-Capillary: 161 mg/dL — ABNORMAL HIGH (ref 70–99)

## 2020-12-26 LAB — TSH: TSH: 0.618 u[IU]/mL (ref 0.350–4.500)

## 2020-12-26 LAB — PROCALCITONIN: Procalcitonin: 0.13 ng/mL

## 2020-12-26 LAB — CORTISOL-AM, BLOOD: Cortisol - AM: 15.7 ug/dL (ref 6.7–22.6)

## 2020-12-26 MED ORDER — SODIUM CHLORIDE 0.9 % IV SOLN: INTRAVENOUS | Status: DC

## 2020-12-26 NOTE — Assessment & Plan Note (Addendum)
-   fall at home PTA 2/2 weakness - CTH and CT spine negative for bleed or fracture - right forehead bruise noted on exam -Follow-up PT/OT eval's: SNF recommended.  Discussed with her son, Kathlene November.  He states patient may not want to go to rehab and ultimately might go home at discharge.  Will have to continue discussions, if she does go home then will need to arrange for some home health. -Ultimately patient decided to discharge home with home health and declined SNF

## 2020-12-26 NOTE — Assessment & Plan Note (Signed)
Continue PPI ?

## 2020-12-26 NOTE — Assessment & Plan Note (Signed)
Continue Cymbalta.

## 2020-12-26 NOTE — Assessment & Plan Note (Addendum)
-   Incidental left thyroid nodule noted on CT cervical spine on admission -Thyroid ultrasound ordered per recommendations.  1 right-sided nodule, 2 left-sided nodules.  Do not meet criteria for biopsy per radiology report.  Annual/biennial ultrasound recommended for follow-up of nodules until stability x5 years confirmed -Also discussed findings with her son Kathlene November) on the phone on 12/27/2020 - Check TSH: Normal, 0.618

## 2020-12-26 NOTE — Assessment & Plan Note (Signed)
-   Continue Eliquis - Continue Cardizem and Toprol

## 2020-12-26 NOTE — Progress Notes (Addendum)
Progress Note    Briana Cook   LKH:[id number]  DOB: 05-16-1934  DOA: 12/25/2020     1  PCP: Patient, No Pcp Per (Inactive)  Initial CC: weakness, falling   Hospital Course: Briana Cook is an 86 year old female with PMH depression, DM II, CKD3a, hyperlipidemia, hypertension, osteoporosis, hard of hearing who presented to the hospital with weakness and a fall. She underwent work-up in the ER which was notable for probable UTI.  Urinalysis was notable for negative nitrites, small leukocyte esterase, greater than 50 WBC, many bacteria, hyaline casts were noted to be present.  She was started on fluids and Rocephin and admitted for further monitoring. Initial lactic acid was also noted to be elevated, 2.8.  Interval History:  Seen this morning in her room.  She was alert and oriented x3.  Overall feeling a little better and was starting to feel some of the pain in her right forehead from her fall.  Large bruise was appreciated.  ROS: Constitutional: positive for fatigue and malaise, Respiratory: negative for cough and sputum, Cardiovascular: negative for chest pain, and Gastrointestinal: negative for change in bowel habits  Assessment & Plan: * Severe sepsis (HCC) - Tachycardia, tachypnea, elevated lactic acid, leukocytosis, urinary source -Continue Rocephin - Follow-up urine culture  Depression - Continue Cymbalta  Chronic kidney disease, stage 3a ([place]) - patient has history of CKD3a. Baseline creat ~ 1 - 1.1, eGFR 46-54   PAF (paroxysmal atrial fibrillation) (HCC) - Continue Eliquis - Continue Cardizem and Toprol  GERD (gastroesophageal reflux disease) - Continue PPI  HLD (hyperlipidemia) - Continue Zocor  HTN (hypertension) - Continue Cardizem and Toprol    Old records reviewed in assessment of this patient  Antimicrobials: Rocephin 8/20 >> current   DVT prophylaxis:  apixaban (ELIQUIS) tablet 5 mg   Code Status:   Code Status: DNR Family Communication:    Disposition Plan: Status is: Inpatient  Remains inpatient appropriate because:IV treatments appropriate due to intensity of illness or inability to take PO and Inpatient level of care appropriate due to severity of illness  Dispo: The patient is from: Home              Anticipated d/c is to: SNF              Patient currently is not medically stable to d/c.   Difficult to place patient No  Risk of unplanned readmission score: Unplanned Admission- Pilot do not use: 21.58   Objective: Blood pressure 125/71, pulse 79, temperature 98 F (36.7 C), temperature source Oral, resp. rate 18, SpO2 98 %.  Examination: General appearance: alert, cooperative, no distress, and hard of hearing Head:  Bruise appreciated on right forehead approximately 3 to 4 cm Eyes:  EOMI, PERRL Lungs: clear to auscultation bilaterally Heart: regular rate and rhythm and S1, S2 normal Abdomen: normal findings: bowel sounds normal and soft, non-tender Extremities:  No edema Skin: mobility and turgor normal Neurologic: No focal deficits.  Alert and oriented x3  Consultants:    Procedures:    Data Reviewed: I have personally reviewed following labs and imaging studies Results for orders placed or performed during the hospital encounter of 12/25/20 (from the past 24 hour(s))  CBG monitoring, ED     Status: Abnormal   Collection Time: 12/25/20  5:22 PM  Result Value Ref Range   Glucose-Capillary 118 (H) 70 - 99 mg/dL  Glucose, capillary     Status: Abnormal   Collection Time: 12/25/20 10:30 PM  Result Value  Ref Range   Glucose-Capillary 132 (H) 70 - 99 mg/dL  Protime-INR     Status: Abnormal   Collection Time: 12/26/20  5:37 AM  Result Value Ref Range   Prothrombin Time 19.7 (H) 11.4 - 15.2 seconds   INR 1.7 (H) 0.8 - 1.2  Cortisol-am, blood     Status: None   Collection Time: 12/26/20  5:37 AM  Result Value Ref Range   Cortisol - AM 15.7 6.7 - 22.6 ug/dL  Procalcitonin     Status: None   Collection  Time: 12/26/20  5:37 AM  Result Value Ref Range   Procalcitonin 0.13 ng/mL  Basic metabolic panel     Status: Abnormal   Collection Time: 12/26/20  5:37 AM  Result Value Ref Range   Sodium 142 135 - 145 mmol/L   Potassium 4.2 3.5 - 5.1 mmol/L   Chloride 102 98 - 111 mmol/L   CO2 31 22 - 32 mmol/L   Glucose, Bld 102 (H) 70 - 99 mg/dL   BUN 22 8 - 23 mg/dL   Creatinine, Ser 0.91 0.44 - 1.00 mg/dL   Calcium 8.0 (L) 8.9 - 10.3 mg/dL   GFR, Estimated >60 >60 mL/min   Anion gap 9 5 - 15  CBC     Status: Abnormal   Collection Time: 12/26/20  5:37 AM  Result Value Ref Range   WBC 20.8 (H) 4.0 - 10.5 K/uL   RBC 3.98 3.87 - 5.11 MIL/uL   Hemoglobin 10.7 (L) 12.0 - 15.0 g/dL   HCT 34.5 (L) 36.0 - 46.0 %   MCV 86.7 80.0 - 100.0 fL   MCH 26.9 26.0 - 34.0 pg   MCHC 31.0 30.0 - 36.0 g/dL   RDW 18.0 (H) 11.5 - 15.5 %   Platelets 303 150 - 400 K/uL   nRBC 0.0 0.0 - 0.2 %  CBC with Differential/Platelet     Status: Abnormal   Collection Time: 12/26/20  5:38 AM  Result Value Ref Range   WBC 20.7 (H) 4.0 - 10.5 K/uL   RBC 3.86 (L) 3.87 - 5.11 MIL/uL   Hemoglobin 10.7 (L) 12.0 - 15.0 g/dL   HCT 33.7 (L) 36.0 - 46.0 %   MCV 87.3 80.0 - 100.0 fL   MCH 27.7 26.0 - 34.0 pg   MCHC 31.8 30.0 - 36.0 g/dL   RDW 18.2 (H) 11.5 - 15.5 %   Platelets 304 150 - 400 K/uL   nRBC 0.0 0.0 - 0.2 %   Neutrophils Relative % 81 %   Neutro Abs 16.8 (H) 1.7 - 7.7 K/uL   Lymphocytes Relative 9 %   Lymphs Abs 1.8 0.7 - 4.0 K/uL   Monocytes Relative 8 %   Monocytes Absolute 1.6 (H) 0.1 - 1.0 K/uL   Eosinophils Relative 0 %   Eosinophils Absolute 0.1 0.0 - 0.5 K/uL   Basophils Relative 0 %   Basophils Absolute 0.0 0.0 - 0.1 K/uL   Immature Granulocytes 2 %   Abs Immature Granulocytes 0.37 (H) 0.00 - 0.07 K/uL  Glucose, capillary     Status: None   Collection Time: 12/26/20  8:56 AM  Result Value Ref Range   Glucose-Capillary 91 70 - 99 mg/dL  Glucose, capillary     Status: Abnormal   Collection Time:  12/26/20 11:53 AM  Result Value Ref Range   Glucose-Capillary 156 (H) 70 - 99 mg/dL    Recent Results (from the past 240 hour(s))  Blood culture (routine x 2)  Status: None (Preliminary result)   Collection Time: 12/25/20 11:37 AM   Specimen: BLOOD  Result Value Ref Range Status   Specimen Description BLOOD BLOOD LEFT WRIST  Final   Special Requests   Final    BOTTLES DRAWN AEROBIC AND ANAEROBIC Blood Culture results may not be optimal due to an inadequate volume of blood received in culture bottles   Culture   Final    NO GROWTH < 24 HOURS Performed at [facility], [address], [place], [place] [address]    Report Status PENDING  Incomplete  Blood culture (routine x 2)     Status: None (Preliminary result)   Collection Time: 12/25/20 11:42 AM   Specimen: BLOOD  Result Value Ref Range Status   Specimen Description BLOOD BLOOD RIGHT HAND  Final   Special Requests   Final    BOTTLES DRAWN AEROBIC AND ANAEROBIC Blood Culture results may not be optimal due to an inadequate volume of blood received in culture bottles   Culture   Final    NO GROWTH < 24 HOURS Performed at [facility], [address], [place], [place] [address]    Report Status PENDING  Incomplete     Radiology Studies: CT HEAD WO CONTRAST (5MM)  Result Date: 12/25/2020 CLINICAL DATA:  Dizziness last night. Patient fell. Bruising to both temporal areas. EXAM: CT HEAD WITHOUT CONTRAST CT CERVICAL SPINE WITHOUT CONTRAST TECHNIQUE: Multidetector CT imaging of the head and cervical spine was performed following the standard protocol without intravenous contrast. Multiplanar CT image reconstructions of the cervical spine were also generated. COMPARISON:  None. FINDINGS: CT HEAD FINDINGS Brain: No evidence of acute infarction, hemorrhage, hydrocephalus, extra-axial collection or mass lesion/mass effect. Patchy areas of white matter hypoattenuation are noted consistent with mild chronic microvascular  ischemic change. Vascular: No hyperdense vessel or unexpected calcification. Skull: Normal. Negative for fracture or focal lesion. Sinuses/Orbits: Globes and orbits are unremarkable. Sinuses are clear. Other: Right lateral frontal scalp contusion/hematoma. CT CERVICAL SPINE FINDINGS Alignment: Normal. Skull base and vertebrae: No acute fracture. No primary bone lesion or focal pathologic process. Soft tissues and spinal canal: No prevertebral fluid or swelling. No visible canal hematoma. Disc levels: Mild loss of disc height at C5-C6 and C6-C7. Mild facet degenerative change on the right at C2-C3 and C3-C4 and on the left at C3-C4. No disc herniation or significant stenosis. Upper chest: No acute findings.  1.5 cm left thyroid nodule. Other: None. IMPRESSION: HEAD CT 1. No acute intracranial abnormalities. 2. Right lateral frontal scalp contusion/hematoma. No skull fracture. CERVICAL CT 1. No fracture or acute finding. 2. 1.5 cm left thyroid nodule. Recommend thyroid US (ref: J Am Coll Radiol. 2015 Feb;12(2): 143-50). Electronically Signed   By: Lajean Manes M.D.   On: 12/25/2020 12:08   CT Cervical Spine Wo Contrast  Result Date: 12/25/2020 CLINICAL DATA:  Dizziness last night. Patient fell. Bruising to both temporal areas. EXAM: CT HEAD WITHOUT CONTRAST CT CERVICAL SPINE WITHOUT CONTRAST TECHNIQUE: Multidetector CT imaging of the head and cervical spine was performed following the standard protocol without intravenous contrast. Multiplanar CT image reconstructions of the cervical spine were also generated. COMPARISON:  None. FINDINGS: CT HEAD FINDINGS Brain: No evidence of acute infarction, hemorrhage, hydrocephalus, extra-axial collection or mass lesion/mass effect. Patchy areas of white matter hypoattenuation are noted consistent with mild chronic microvascular ischemic change. Vascular: No hyperdense vessel or unexpected calcification. Skull: Normal. Negative for fracture or focal lesion. Sinuses/Orbits:  Globes and orbits are  unremarkable. Sinuses are clear. Other: Right lateral frontal scalp contusion/hematoma. CT CERVICAL SPINE FINDINGS Alignment: Normal. Skull base and vertebrae: No acute fracture. No primary bone lesion or focal pathologic process. Soft tissues and spinal canal: No prevertebral fluid or swelling. No visible canal hematoma. Disc levels: Mild loss of disc height at C5-C6 and C6-C7. Mild facet degenerative change on the right at C2-C3 and C3-C4 and on the left at C3-C4. No disc herniation or significant stenosis. Upper chest: No acute findings.  1.5 cm left thyroid nodule. Other: None. IMPRESSION: HEAD CT 1. No acute intracranial abnormalities. 2. Right lateral frontal scalp contusion/hematoma. No skull fracture. CERVICAL CT 1. No fracture or acute finding. 2. 1.5 cm left thyroid nodule. Recommend thyroid US (ref: J Am Coll Radiol. 2015 Feb;12(2): 143-50). Electronically Signed   By: Lajean Manes M.D.   On: 12/25/2020 12:08   DG Chest Port 1 View  Result Date: 12/25/2020 CLINICAL DATA:  Weakness EXAM: PORTABLE CHEST 1 VIEW COMPARISON:  Thirteen days ago FINDINGS: Chronic cardiomegaly. Improved aeration. No consolidation, edema, effusion, or pneumothorax. 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 Extensive artifact from EKG leads. IMPRESSION: No evidence of active disease. Electronically Signed   By: Monte Fantasia M.D.   On: 12/25/2020 11:10   CT HEAD WO CONTRAST (5MM)  Final Result    CT Cervical Spine Wo Contrast  Final Result    DG Chest Port 1 View  Final Result      Scheduled Meds:  apixaban  5 mg Oral BID   aspirin EC  81 mg Oral Daily   calcium-vitamin D   Oral Daily   diltiazem  240 mg Oral Daily   DULoxetine  60 mg Oral Daily   insulin aspart  0-9 Units Subcutaneous TID AC & HS   loratadine  10 mg Oral Daily   metoprolol succinate  50 mg Oral BID   pantoprazole  40 mg Oral BID AC   simvastatin  40 mg Oral QHS   sodium  chloride flush  10 mL Intravenous Q12H   vitamin B-12  1,000 mcg Oral Daily   PRN Meds: acetaminophen **OR** acetaminophen, magnesium hydroxide, ondansetron **OR** ondansetron (ZOFRAN) IV, traMADol, traZODone Continuous Infusions:  sodium chloride     cefTRIAXone (ROCEPHIN)  IV       LOS: 1 day  Time spent: Greater than 50% of the 35 minute visit was spent in counseling/coordination of care for the patient as laid out in the A&P.   Dwyane Dee, MD [facility] 12/26/2020, 2:42 PM

## 2020-12-26 NOTE — Evaluation (Signed)
Physical Therapy Evaluation Patient Details Name: Briana Cook MRN: [id number] DOB: 03-06-1935 Today's Date: 12/26/2020   History of Present Illness  Pt is a 86yo female presenting s/p fall, which may have been d/t syncope. Hx of several falls over the last few days. She presents with brusing on the forehead. PMHx significant for diabetes and atrial fibrillation, currently on blood thinners. Pt also currently present with UTI which may be source of sepsis.  Clinical Impression  The pt presents this session with pain being the main limiting factor for progression of OOB mobility. The pt was highly motivated to participate, however reports 9/10 pain with sit<>stand WB activity. At this time the pt is not safe for d/c home as she is well below her baseline level of function. Rec SNF.     Follow Up Recommendations SNF    Equipment Recommendations       Recommendations for Other Services       Precautions / Restrictions Precautions Precautions: Fall Restrictions Weight Bearing Restrictions: No      Mobility  Bed Mobility Overal bed mobility: Needs Assistance Bed Mobility: Supine to Sit     Supine to sit: Min assist;HOB elevated          Transfers Overall transfer level: Needs assistance   Transfers: Sit to/from Stand Sit to Stand: Mod assist         General transfer comment: Pt reporting increased pain with WB on LEs. She is able to achieve standing but is unable to maintain 2/2 c/o 10/10 pain.  Ambulation/Gait             General Gait Details: Unable to tolerate ambulation  Stairs            Wheelchair Mobility    Modified Rankin (Stroke Patients Only)       Balance Overall balance assessment: History of Falls;Needs assistance Sitting-balance support: Feet supported Sitting balance-Leahy Scale: Good       Standing balance-Leahy Scale: Zero                               Pertinent Vitals/Pain Pain Assessment: 0-10 Pain  Score: 7  Pain Location: R hip, legs Pain Descriptors / Indicators: Sore Pain Intervention(s): Limited activity within patient's tolerance;Monitored during session    Home Living Family/patient expects to be discharged to:: Private residence Living Arrangements: Children;Alone Available Help at Discharge: Family;Available PRN/intermittently Type of Home: House       Home Layout: Two level Home Equipment: Walker - 2 wheels;Walker - 4 wheels      Prior Function Level of Independence: Needs assistance   Gait / Transfers Assistance Needed: RW for mobility, limits mobility to when supervision is present  ADL's / Homemaking Assistance Needed: Reports assistance for homemaking needs  Comments: Reports utilizing her RW for most mobility, however she ambulates to and from the bathroom without AD. This is where she reports having all of her falls.     Hand Dominance        Extremity/Trunk Assessment   Upper Extremity Assessment Upper Extremity Assessment: RUE deficits/detail;LUE deficits/detail RUE Deficits / Details: Impaired shoulder flexion. PROM>AROM. LUE Deficits / Details: Impaired shoulder flexion. PROM>AROM.    Lower Extremity Assessment Lower Extremity Assessment: RLE deficits/detail;LLE deficits/detail RLE Deficits / Details: Limited hip flexion, knee flexion. RLE Sensation: WNL LLE Deficits / Details: Limited knee flexion, hip flexion 2/2 pain LLE Sensation: WNL       Communication  Communication: HOH  Cognition Arousal/Alertness: Awake/alert Behavior During Therapy: WFL for tasks assessed/performed Overall Cognitive Status: Within Functional Limits for tasks assessed                                        General Comments      Exercises     Assessment/Plan    PT Assessment Patient needs continued PT services  PT Problem List Decreased strength;Decreased mobility;Decreased activity tolerance;Decreased balance;Decreased range of  motion       PT Treatment Interventions Functional mobility training;Stair training;Balance training;Gait training;Therapeutic exercise;Therapeutic activities;Patient/family education    PT Goals (Current goals can be found in the Care Plan section)  Acute Rehab PT Goals Patient Stated Goal: progress with therapy PT Goal Formulation: With patient Time For Goal Achievement: 01/09/21 Potential to Achieve Goals: Fair    Frequency Min 2X/week   Barriers to discharge Inaccessible home environment stairs at entrance.    Co-evaluation               AM-PAC PT "6 Clicks" Mobility  Outcome Measure Help needed turning from your back to your side while in a flat bed without using bedrails?: A Little Help needed moving from lying on your back to sitting on the side of a flat bed without using bedrails?: A Little Help needed moving to and from a bed to a chair (including a wheelchair)?: A Lot Help needed standing up from a chair using your arms (e.g., wheelchair or bedside chair)?: A Lot Help needed to walk in hospital room?: Total Help needed climbing 3-5 steps with a railing? : Total 6 Click Score: 12    End of Session Equipment Utilized During Treatment: Gait belt Activity Tolerance: Patient limited by pain Patient left: in bed;with chair alarm set;with bed alarm set;with call bell/phone within reach Nurse Communication: Mobility status PT Visit Diagnosis: Unsteadiness on feet (R26.81);Muscle weakness (generalized) (M62.81);Difficulty in walking, not elsewhere classified (R26.2);History of falling (Z91.81);Repeated falls (R29.6)    Time: 6270-3500 PT Time Calculation (min) (ACUTE ONLY): 37 min   Charges:   PT Evaluation $PT Eval Moderate Complexity: 1 Mod PT Treatments $Therapeutic Activity: 23-37 mins        2:12 PM, 12/26/20 Nicola Quesnell A. Mordecai Maes PT, DPT Physical Therapist - [facility]   Idonia Zollinger A Taysean Wager 12/26/2020, 2:05 PM

## 2020-12-26 NOTE — Hospital Course (Addendum)
Briana Cook is an 86 year old female with PMH depression, DM II, CKD3a, hyperlipidemia, hypertension, osteoporosis, hard of hearing who presented to the hospital with weakness and a fall. She underwent work-up in the ER which was notable for probable UTI.  Urinalysis was notable for negative nitrites, small leukocyte esterase, greater than 50 WBC, many bacteria, hyaline casts were noted to be present.  She was started on fluids and Rocephin and admitted for further monitoring. Initial lactic acid was also noted to be elevated, 2.8.  See below for further A&P

## 2020-12-26 NOTE — Plan of Care (Signed)
  Problem: Education: Goal: Knowledge of General Education information will improve Description: Including pain rating scale, medication(s)/side effects and non-pharmacologic comfort measures Outcome: Progressing   Problem: Safety: Goal: Ability to remain free from injury will improve Outcome: Progressing   

## 2020-12-26 NOTE — Assessment & Plan Note (Signed)
-   Continue Cardizem and Toprol ° °

## 2020-12-26 NOTE — Assessment & Plan Note (Addendum)
-   Tachycardia, tachypnea, elevated lactic acid, leukocytosis, urinary source -Continue Rocephin - Follow-up urine culture: Growing E. coli, sensitivities reviewed -De-escalated from Rocephin down to cefuroxime to complete course at discharge

## 2020-12-26 NOTE — Assessment & Plan Note (Signed)
-  patient has history of CKD3a. Baseline creat ~ 1 - 1.1, eGFR 46-54

## 2020-12-26 NOTE — Assessment & Plan Note (Signed)
Continue Zocor 

## 2020-12-27 DIAGNOSIS — E041 Nontoxic single thyroid nodule: Secondary | ICD-10-CM

## 2020-12-27 LAB — GLUCOSE, CAPILLARY
Glucose-Capillary: 125 mg/dL — ABNORMAL HIGH (ref 70–99)
Glucose-Capillary: 184 mg/dL — ABNORMAL HIGH (ref 70–99)
Glucose-Capillary: 107 mg/dL — ABNORMAL HIGH (ref 70–99)
Glucose-Capillary: 122 mg/dL — ABNORMAL HIGH (ref 70–99)

## 2020-12-27 LAB — BASIC METABOLIC PANEL
Anion gap: 6 (ref 5–15)
BUN: 18 mg/dL (ref 8–23)
CO2: 29 mmol/L (ref 22–32)
Calcium: 8.2 mg/dL — ABNORMAL LOW (ref 8.9–10.3)
Chloride: 106 mmol/L (ref 98–111)
Creatinine, Ser: 0.89 mg/dL (ref 0.44–1.00)
GFR, Estimated: >60 mL/min (ref >60)
Glucose, Bld: 135 mg/dL — ABNORMAL HIGH (ref 70–99)
Potassium: 3.9 mmol/L (ref 3.5–5.1)
Sodium: 141 mmol/L (ref 135–145)

## 2020-12-27 LAB — CBC WITH DIFFERENTIAL/PLATELET
Abs Immature Granulocytes: 0.26 10*3/uL — ABNORMAL HIGH (ref 0.00–0.07)
Basophils Absolute: 0 10*3/uL (ref 0.0–0.1)
Basophils Relative: 0 %
Eosinophils Absolute: 0.2 10*3/uL (ref 0.0–0.5)
Eosinophils Relative: 1 %
HCT: 31.3 % — ABNORMAL LOW (ref 36.0–46.0)
Hemoglobin: 9.9 g/dL — ABNORMAL LOW (ref 12.0–15.0)
Immature Granulocytes: 1 %
Lymphocytes Relative: 8 %
Lymphs Abs: 1.5 10*3/uL (ref 0.7–4.0)
MCH: 27.8 pg (ref 26.0–34.0)
MCHC: 31.6 g/dL (ref 30.0–36.0)
MCV: 87.9 fL (ref 80.0–100.0)
Monocytes Absolute: 1.6 10*3/uL — ABNORMAL HIGH (ref 0.1–1.0)
Monocytes Relative: 9 %
Neutro Abs: 14.9 10*3/uL — ABNORMAL HIGH (ref 1.7–7.7)
Neutrophils Relative %: 81 %
Platelets: 245 10*3/uL (ref 150–400)
RBC: 3.56 MIL/uL — ABNORMAL LOW (ref 3.87–5.11)
RDW: 18.2 % — ABNORMAL HIGH (ref 11.5–15.5)
WBC: 18.4 10*3/uL — ABNORMAL HIGH (ref 4.0–10.5)
nRBC: 0 % (ref 0.0–0.2)

## 2020-12-27 LAB — LIPID PANEL
Cholesterol: 107 mg/dL (ref 0–200)
HDL: 30 mg/dL — ABNORMAL LOW (ref >40)
LDL Cholesterol: 50 mg/dL (ref 0–99)
Total CHOL/HDL Ratio: 3.6 RATIO
Triglycerides: 137 mg/dL (ref <150)
VLDL: 27 mg/dL (ref 0–40)

## 2020-12-27 LAB — MAGNESIUM: Magnesium: 1.5 mg/dL — ABNORMAL LOW (ref 1.7–2.4)

## 2020-12-27 MED ORDER — PROSOURCE PLUS PO LIQD
30.0000 mL | Freq: Two times a day (BID) | ORAL | Status: DC
  Filled 2020-12-27 (×2): qty 30

## 2020-12-27 MED ORDER — METOPROLOL TARTRATE 5 MG/5ML IV SOLN: 5.0000 mg | INTRAVENOUS | Status: DC | PRN

## 2020-12-27 MED ORDER — ROSUVASTATIN CALCIUM 10 MG PO TABS
5.0000 mg | ORAL_TABLET | Freq: Every day | ORAL | Status: DC
  Administered 2020-12-27: 5 mg via ORAL
  Filled 2020-12-27: qty 1

## 2020-12-27 MED ORDER — GLUCERNA SHAKE PO LIQD: 237.0000 mL | Freq: Two times a day (BID) | ORAL | Status: DC

## 2020-12-27 MED ORDER — MAGNESIUM SULFATE 2 GM/50ML IV SOLN
2.0000 g | Freq: Once | INTRAVENOUS | Status: AC
  Administered 2020-12-27: 2 g via INTRAVENOUS
  Filled 2020-12-27: qty 50

## 2020-12-27 MED ORDER — ADULT MULTIVITAMIN W/MINERALS CH
1.0000 | ORAL_TABLET | Freq: Every day | ORAL | Status: DC
  Administered 2020-12-27 – 2020-12-28 (×2): 1 via ORAL
  Filled 2020-12-27 (×2): qty 1

## 2020-12-27 NOTE — Progress Notes (Signed)
Progress Note    Briana Cook   WCH:[id number]  DOB: 1934/11/14  DOA: 12/25/2020     2  PCP: Patient, No Pcp Per (Inactive)  Initial CC: weakness, falling   Hospital Course: Ms. Cormier is an 86 year old female with PMH depression, DM II, CKD3a, hyperlipidemia, hypertension, osteoporosis, hard of hearing who presented to the hospital with weakness and a fall. She underwent work-up in the ER which was notable for probable UTI.  Urinalysis was notable for negative nitrites, small leukocyte esterase, greater than 50 WBC, many bacteria, hyaline casts were noted to be present.  She was started on fluids and Rocephin and admitted for further monitoring. Initial lactic acid was also noted to be elevated, 2.8.  Interval History:  No events overnight.  Called and spoke with her son Ronalee Belts this afternoon.  Update given with questions answered.  He does not think that she will go to SNF but he will try and encourage her to consider this option otherwise we will pursue home health. Patient otherwise feels okay and was resting in bed comfortably this morning.  Right side of head still sore from her fall.  Bruising appears stable.  ROS: Constitutional: positive for fatigue and malaise, Respiratory: negative for cough and sputum, Cardiovascular: negative for chest pain, and Gastrointestinal: negative for change in bowel habits  Assessment & Plan: * Severe sepsis (HCC) - Tachycardia, tachypnea, elevated lactic acid, leukocytosis, urinary source -Continue Rocephin - Follow-up urine culture: Growing E. coli, awaiting sensitivities.  Will de-escalate once those are available  Thyroid nodule - Incidental left thyroid nodule noted on CT cervical spine on admission -Thyroid ultrasound ordered per recommendations.  1 right-sided nodule, 2 left-sided nodules.  Do not meet criteria for biopsy per radiology report.  Annual/biennial ultrasound recommended for follow-up of nodules until stability x5 years  confirmed -Also discussed findings with her son Ronalee Belts) on the phone on 12/27/2020 - Check TSH: Normal, 0.618  Fall at home, initial encounter - fall at home PTA 2/2 weakness - CTH and CT spine negative for bleed or fracture - right forehead bruise noted on exam -Follow-up PT/OT eval's: SNF recommended.  Discussed with her son, Ronalee Belts.  He states patient may not want to go to rehab and ultimately might go home at discharge.  Will have to continue discussions, if she does go home then will need to arrange for some home health.  Depression - Continue Cymbalta  Chronic kidney disease, stage 3a ([place]) - patient has history of CKD3a. Baseline creat ~ 1 - 1.1, eGFR 46-54   PAF (paroxysmal atrial fibrillation) (HCC) - Continue Eliquis - Continue Cardizem and Toprol  GERD (gastroesophageal reflux disease) - Continue PPI  HLD (hyperlipidemia) - Continue Zocor  HTN (hypertension) - Continue Cardizem and Toprol   Old records reviewed in assessment of this patient  Antimicrobials: Rocephin 8/20 >> current   DVT prophylaxis:  apixaban (ELIQUIS) tablet 5 mg   Code Status:   Code Status: DNR Family Communication:   Disposition Plan: Status is: Inpatient  Remains inpatient appropriate because:IV treatments appropriate due to intensity of illness or inability to take PO and Inpatient level of care appropriate due to severity of illness  Dispo: The patient is from: Home              Anticipated d/c is to: SNF versus home with home health              Patient currently is not medically stable to d/c.   Difficult to  place patient No  Risk of unplanned readmission score: Unplanned Admission- Pilot do not use: 22.09   Objective: Blood pressure (!) 131/99, pulse (!) 109, temperature 98 F (36.7 C), resp. rate 20, weight 90.7 kg, SpO2 100 %.  Examination: General appearance: alert, cooperative, no distress, and hard of hearing Head:  Bruise appreciated on right forehead approximately 3  to 4 cm Eyes:  EOMI, PERRL Lungs: clear to auscultation bilaterally Heart: regular rate and rhythm and S1, S2 normal Abdomen: normal findings: bowel sounds normal and soft, non-tender Extremities:  No edema Skin: mobility and turgor normal Neurologic: No focal deficits.  Alert and oriented x3  Consultants:    Procedures:    Data Reviewed: I have personally reviewed following labs and imaging studies Results for orders placed or performed during the hospital encounter of 12/25/20 (from the past 24 hour(s))  Glucose, capillary     Status: Abnormal   Collection Time: 12/26/20  4:00 PM  Result Value Ref Range   Glucose-Capillary 135 (H) 70 - 99 mg/dL  Glucose, capillary     Status: Abnormal   Collection Time: 12/26/20  8:33 PM  Result Value Ref Range   Glucose-Capillary 161 (H) 70 - 99 mg/dL  Basic metabolic panel     Status: Abnormal   Collection Time: 12/27/20  5:51 AM  Result Value Ref Range   Sodium 141 135 - 145 mmol/L   Potassium 3.9 3.5 - 5.1 mmol/L   Chloride 106 98 - 111 mmol/L   CO2 29 22 - 32 mmol/L   Glucose, Bld 135 (H) 70 - 99 mg/dL   BUN 18 8 - 23 mg/dL   Creatinine, Ser 0.89 0.44 - 1.00 mg/dL   Calcium 8.2 (L) 8.9 - 10.3 mg/dL   GFR, Estimated >60 >60 mL/min   Anion gap 6 5 - 15  CBC with Differential/Platelet     Status: Abnormal   Collection Time: 12/27/20  5:51 AM  Result Value Ref Range   WBC 18.4 (H) 4.0 - 10.5 K/uL   RBC 3.56 (L) 3.87 - 5.11 MIL/uL   Hemoglobin 9.9 (L) 12.0 - 15.0 g/dL   HCT 31.3 (L) 36.0 - 46.0 %   MCV 87.9 80.0 - 100.0 fL   MCH 27.8 26.0 - 34.0 pg   MCHC 31.6 30.0 - 36.0 g/dL   RDW 18.2 (H) 11.5 - 15.5 %   Platelets 245 150 - 400 K/uL   nRBC 0.0 0.0 - 0.2 %   Neutrophils Relative % 81 %   Neutro Abs 14.9 (H) 1.7 - 7.7 K/uL   Lymphocytes Relative 8 %   Lymphs Abs 1.5 0.7 - 4.0 K/uL   Monocytes Relative 9 %   Monocytes Absolute 1.6 (H) 0.1 - 1.0 K/uL   Eosinophils Relative 1 %   Eosinophils Absolute 0.2 0.0 - 0.5 K/uL    Basophils Relative 0 %   Basophils Absolute 0.0 0.0 - 0.1 K/uL   Immature Granulocytes 1 %   Abs Immature Granulocytes 0.26 (H) 0.00 - 0.07 K/uL  Magnesium     Status: Abnormal   Collection Time: 12/27/20  5:51 AM  Result Value Ref Range   Magnesium 1.5 (L) 1.7 - 2.4 mg/dL  Glucose, capillary     Status: Abnormal   Collection Time: 12/27/20  8:46 AM  Result Value Ref Range   Glucose-Capillary 125 (H) 70 - 99 mg/dL  Glucose, capillary     Status: Abnormal   Collection Time: 12/27/20 11:26 AM  Result Value Ref  Range   Glucose-Capillary 184 (H) 70 - 99 mg/dL    Recent Results (from the past 240 hour(s))  Blood culture (routine x 2)     Status: None (Preliminary result)   Collection Time: 12/25/20 11:37 AM   Specimen: BLOOD  Result Value Ref Range Status   Specimen Description BLOOD BLOOD LEFT WRIST  Final   Special Requests   Final    BOTTLES DRAWN AEROBIC AND ANAEROBIC Blood Culture results may not be optimal due to an inadequate volume of blood received in culture bottles   Culture   Final    NO GROWTH 2 DAYS Performed at [facility], [address], [place], [place] [address]    Report Status PENDING  Incomplete  Blood culture (routine x 2)     Status: None (Preliminary result)   Collection Time: 12/25/20 11:42 AM   Specimen: BLOOD  Result Value Ref Range Status   Specimen Description BLOOD BLOOD RIGHT HAND  Final   Special Requests   Final    BOTTLES DRAWN AEROBIC AND ANAEROBIC Blood Culture results may not be optimal due to an inadequate volume of blood received in culture bottles   Culture   Final    NO GROWTH 2 DAYS Performed at [facility], [address], [place], [place] [address]    Report Status PENDING  Incomplete  Urine Culture     Status: Abnormal (Preliminary result)   Collection Time: 12/25/20 12:19 PM   Specimen: Urine, Random  Result Value Ref Range Status   Specimen Description   Final    URINE, RANDOM Performed at [facility], [address] [place], [place] [address]    Special Requests   Final    NONE Performed at [facility], [address], [place], [place] [address]    Culture (A)  Final    >=100,000 COLONIES/mL ESCHERICHIA COLI SUSCEPTIBILITIES TO FOLLOW Performed at [facility], [place] [address] [place],  [address]    Report Status PENDING  Incomplete     Radiology Studies: US THYROID  Result Date: 12/27/2020 CLINICAL DATA:  Nodule on CT cervical spine EXAM: THYROID ULTRASOUND TECHNIQUE: Ultrasound examination of the thyroid gland and adjacent soft tissues was performed. COMPARISON:  CT previous day FINDINGS: Parenchymal Echotexture: Moderately heterogenous Isthmus: 0.3 cm thickness Right lobe: 3.9 x 1.1 x 1.7 cm Left lobe: 3.12.1 cm _________________________________________________________ Estimated total number of nodules >/= 1 cm: 3 Number of spongiform nodules >/=  2 cm not described below (TR1): 0 Number of mixed cystic and solid nodules >/= 1.5 cm not described below (TR2): 0 _________________________________________________________ Nodule # 1: Location: Right; mid Maximum size: 2.3 cm; Other 2 dimensions: 1.5 x 0.8 cm Composition: mixed cystic and solid (1) Echogenicity: hypoechoic (2) Shape: not taller-than-wide (0) Margins: smooth (0) Echogenic foci: none (0) ACR TI-RADS total points: 3. ACR TI-RADS risk category: TR3 (3 points). ACR TI-RADS recommendations: *Given size (>/= 1.5 - 2.4 cm) and appearance, a follow-up ultrasound in 1 year should be considered based on TI-RADS criteria. _________________________________________________________ Nodule # 2: Location: Left; mid Maximum size: 2 cm; Other 2 dimensions: 1.9 x 1.5 cm Composition: mixed cystic and solid (1) Echogenicity: hypoechoic (2) Shape: not taller-than-wide (0) Margins: smooth (0) Echogenic foci: none (0) ACR TI-RADS total points: 3. ACR TI-RADS risk category: TR3 (3 points). ACR TI-RADS recommendations:  *Given size (>/= 1.5 - 2.4 cm) and appearance, a follow-up ultrasound in 1 year should be considered based on TI-RADS  criteria. _________________________________________________________ Nodule # 3: Location: Left; medial Maximum size: 1.3 cm; Other 2 dimensions: 0.9 x 0.6 cm Composition: solid/almost completely solid (2) Echogenicity: hypoechoic (2) Shape: not taller-than-wide (0) Margins: smooth (0) Echogenic foci: none (0) ACR TI-RADS total points: 4. ACR TI-RADS risk category: TR4 (4-6 points). ACR TI-RADS recommendations: *Given size (>/= 1 - 1.4 cm) and appearance, a follow-up ultrasound in 1 year should be considered based on TI-RADS criteria. _________________________________________________________ No regional cervical adenopathy identified. IMPRESSION: 1. Normal-sized thyroid with bilateral nodules. None meets criteria for biopsy. 2. Recommend annual/biennial ultrasound follow-up of nodules as above, until stability x5 years confirmed. The above is in keeping with the ACR TI-RADS recommendations - J Am Coll Radiol 2017;14:587-595. Electronically Signed   By: Lucrezia Europe M.D.   On: 12/27/2020 07:34   US THYROID  Final Result    CT HEAD WO CONTRAST (5MM)  Final Result    CT Cervical Spine Wo Contrast  Final Result    DG Chest Port 1 View  Final Result      Scheduled Meds:  apixaban  5 mg Oral BID   aspirin EC  81 mg Oral Daily   calcium-vitamin D   Oral Daily   diltiazem  240 mg Oral Daily   DULoxetine  60 mg Oral Daily   insulin aspart  0-9 Units Subcutaneous TID AC & HS   loratadine  10 mg Oral Daily   metoprolol succinate  50 mg Oral BID   pantoprazole  40 mg Oral BID AC   simvastatin  40 mg Oral QHS   sodium chloride flush  10 mL Intravenous Q12H   vitamin B-12  1,000 mcg Oral Daily   PRN Meds: acetaminophen **OR** acetaminophen, magnesium hydroxide, metoprolol tartrate, ondansetron **OR** ondansetron (ZOFRAN) IV, traMADol, traZODone Continuous Infusions:  sodium chloride 75  mL/hr at 12/27/20 0932   cefTRIAXone (ROCEPHIN)  IV 2 g (12/26/20 1533)     LOS: 2 days  Time spent: Greater than 50% of the 35 minute visit was spent in counseling/coordination of care for the patient as laid out in the A&P.   Dwyane Dee, MD [facility] 12/27/2020, 1:43 PM

## 2020-12-27 NOTE — TOC Initial Note (Signed)
Transition of Care [facility]) - Initial/Assessment Note    Patient Details  Name: Briana Cook MRN: [id number] Date of Birth: [birth year]  Transition of Care [facility]) CM/SW Contact:    Shelbie Hutching, RN Phone Number: 12/27/2020, 1:30 PM  Clinical Narrative:                 Patient admitted to the hospital with sepsis from UTI.  RNCM met with patient at the bedside, patient sitting up eating lunch.  Patient is from home where she lives with her son and granddaughter.  Patient walks with a rollator and is mostly independent but she does have her granddaughter to help with ADL's if needed.  PT is recommending SNF but patient wants to go home and reports that her granddaughter will take care of her.  Patient agrees to home health services and is okay with [facility].  Corene Cornea with [facility] accepted [facility] [place] referral for RN, PT, OT and aide.  Patient's son will be able to pick her up at discharge.    Expected Discharge Plan: Skilled Nursing Facility Barriers to Discharge: Continued Medical Work up   Patient Goals and CMS Choice Patient states their goals for this hospitalization and ongoing recovery are:: Agrees to SNF for rehab CMS Medicare.gov Compare Post Acute Care list provided to:: Patient Choice offered to / list presented to : Patient, Spouse  Expected Discharge Plan and Services Expected Discharge Plan: [place]   Discharge Planning Services: CM Consult Post Acute Care Choice: [place] Living arrangements for the past 2 months: Single Family Home                 DME Arranged: N/A DME Agency: NA       HH Arranged: NA HH Agency: NA        Prior Living Arrangements/Services Living arrangements for the past 2 months: Single Family Home Lives with:: Spouse Patient language and need for interpreter reviewed:: Yes Do you feel safe going back to the place where you live?: Yes      Need for Family Participation in Patient Care: Yes (Comment) Care giver  support system in place?: Yes (comment) (son and granddaughter) Current home services: DME (rollator, walker, shower seat and grab bars, cane) Criminal Activity/Legal Involvement Pertinent to Current Situation/Hospitalization: No - Comment as needed  Activities of Daily Living Home Assistive Devices/Equipment: Environmental consultant (specify type) ADL Screening (condition at time of admission) Patient's cognitive ability adequate to safely complete daily activities?: Yes Is the patient deaf or have difficulty hearing?: Yes Does the patient have difficulty seeing, even when wearing glasses/contacts?: No Does the patient have difficulty concentrating, remembering, or making decisions?: Yes Patient able to express need for assistance with ADLs?: Yes Does the patient have difficulty dressing or bathing?: Yes Independently performs ADLs?: No Communication: Independent Dressing (OT): Needs assistance Is this a change from baseline?: Change from baseline, expected to last <3days Grooming: Independent Feeding: Independent Bathing: Needs assistance Is this a change from baseline?: Change from baseline, expected to last >3 days Toileting: Needs assistance Is this a change from baseline?: Change from baseline, expected to last >3days In/Out Bed: Needs assistance Is this a change from baseline?: Change from baseline, expected to last >3 days Walks in Home: Independent with device (comment) Does the patient have difficulty walking or climbing stairs?: Yes Weakness of Legs: Both Weakness of Arms/Hands: None  Permission Sought/Granted Permission sought to share information with : Case Manager, Family Supports, Chartered certified accountant granted to  share information with : Yes, Verbal Permission Granted  Share Information with NAME: Karesa Maultsby  Permission granted to share info w AGENCY: SNF's  Permission granted to share info w Relationship: son  Permission granted to share info w Contact  Information: [phone]  Emotional Assessment Appearance:: Appears stated age Attitude/Demeanor/Rapport: Engaged Affect (typically observed): Accepting Orientation: : Oriented to Self, Oriented to Place, Oriented to  Time, Oriented to Situation Alcohol / Substance Use: Not Applicable Psych Involvement: No (comment)  Admission diagnosis:  Lower urinary tract infectious disease [N39.0] Sepsis due to gram-negative UTI ([place]) [A41.50, N39.0] Sepsis without acute organ dysfunction, due to unspecified organism [facility]) [A41.9] Patient Active Problem List   Diagnosis Date Noted   Fall at home, initial encounter 12/26/2020   Thyroid nodule 12/26/2020   Severe sepsis ([place]) 12/25/2020   Acute hypoxemic respiratory failure due to COVID-19 ([place]) 12/13/2020   PAF (paroxysmal atrial fibrillation) ([place]) 12/12/2020   Chronic kidney disease, stage 3a ([place]) 12/12/2020   Depression    COVID-19 virus detected    CAP (community acquired pneumonia) 04/06/2018   HTN (hypertension) 04/06/2018   Diabetes ([place]) 04/06/2018   HLD (hyperlipidemia) 04/06/2018   GERD (gastroesophageal reflux disease) 04/06/2018   Viral enteritis 03/01/2016   Hypotension 03/01/2016   Acute renal failure (ARF) ([place]) 03/01/2016   Acidosis 03/01/2016   UTI (urinary tract infection) 03/01/2016   Viral gastroenteritis 03/01/2016   PCP:  Patient, No Pcp Per (Inactive) Pharmacy:   [facility], [place] [address] Phone: [phone] Fax: [phone]     Social Determinants of Health (SDOH) Interventions    Readmission Risk Interventions No flowsheet data found.

## 2020-12-27 NOTE — Progress Notes (Signed)
Physical Therapy Treatment Patient Details Name: Briana Cook MRN: [id number] DOB: 1934-10-06 Today's Date: 12/27/2020    History of Present Illness Pt is a 86yo female presenting s/p fall, which may have been d/t syncope. Hx of several falls over the last few days. She presents with brusing on the forehead. PMHx significant for diabetes and atrial fibrillation, currently on blood thinners. Pt also currently present with UTI which may be source of sepsis.    PT Comments    Pt received supine in bed, agreeable to therapy. Pt agreeable to plan to perform stand-pivot transfer to recliner. RN present to provide pain meds and adjust IV, she provided CGA during STS for pt comfort. She required MOD A for all bed mobility as she did not follow sequencing provided by PT. RW and MOD A to lift was provided to stand. STS x2 reps was performed; significant posterior lean was demo. VC and TC for anterior weightshift. Pt remained standing <5 minutes each stand. Pt began yelling "I'm falling" immediately upon standing. PT determined stand-pivot transfer was not going to be safe today - pt returned to bed. Total A x2 to reposition towards HOB. Would benefit from skilled PT to address above deficits and promote optimal return to PLOF.   Follow Up Recommendations  SNF     Equipment Recommendations  Other (comment) (TBD to next venue of care)    Recommendations for Other Services       Precautions / Restrictions Precautions Precautions: Fall Restrictions Weight Bearing Restrictions: No    Mobility  Bed Mobility Overal bed mobility: Needs Assistance Bed Mobility: Supine to Sit;Sit to Supine     Supine to sit: HOB elevated;Mod assist Sit to supine: Mod assist   General bed mobility comments: MOD A to lift trunk to sitting and manage BLE back to bed    Transfers Overall transfer level: Needs assistance Equipment used: Rolling walker (2 wheeled) Transfers: Sit to/from Stand Sit to Stand:  Mod assist;From elevated surface         General transfer comment: Pt reporting increased pain with WB on LEs. 2 reps with immediate yelling "I'm falling"  Ambulation/Gait             General Gait Details: deferred - unable to remain standing   Stairs             Wheelchair Mobility    Modified Rankin (Stroke Patients Only)       Balance Overall balance assessment: History of Falls;Needs assistance Sitting-balance support: Feet supported Sitting balance-Leahy Scale: Good     Standing balance support: Bilateral upper extremity supported Standing balance-Leahy Scale: Poor Standing balance comment: Using RW and CGA to steady. Able to maintain standing for <5 seconds.                            Cognition Arousal/Alertness: Awake/alert Behavior During Therapy: WFL for tasks assessed/performed Overall Cognitive Status: Within Functional Limits for tasks assessed                                        Exercises      General Comments        Pertinent Vitals/Pain Pain Assessment: 0-10 Pain Score: 10-Worst pain ever Pain Location: R hip, legs Pain Descriptors / Indicators: Sharp Pain Intervention(s): Limited activity within patient's tolerance;Monitored during session;RN gave pain meds  during session;Repositioned    Home Living                      Prior Function            PT Goals (current goals can now be found in the care plan section) Acute Rehab PT Goals Patient Stated Goal: progress with therapy PT Goal Formulation: With patient Time For Goal Achievement: 01/09/21 Potential to Achieve Goals: Fair    Frequency    Min 2X/week      PT Plan      Co-evaluation              AM-PAC PT "6 Clicks" Mobility   Outcome Measure  Help needed turning from your back to your side while in a flat bed without using bedrails?: A Little Help needed moving from lying on your back to sitting on the side of a  flat bed without using bedrails?: A Little Help needed moving to and from a bed to a chair (including a wheelchair)?: A Lot Help needed standing up from a chair using your arms (e.g., wheelchair or bedside chair)?: A Lot Help needed to walk in hospital room?: Total Help needed climbing 3-5 steps with a railing? : Total 6 Click Score: 12    End of Session Equipment Utilized During Treatment: Gait belt Activity Tolerance: Patient limited by pain Patient left: in bed;with bed alarm set;with call bell/phone within reach Nurse Communication: Mobility status (RN present through session) PT Visit Diagnosis: Unsteadiness on feet (R26.81);Muscle weakness (generalized) (M62.81);Difficulty in walking, not elsewhere classified (R26.2);History of falling (Z91.81);Repeated falls (R29.6)     Time: 1000-1018 PT Time Calculation (min) (ACUTE ONLY): 18 min  Charges:  $Therapeutic Activity: 8-22 mins                    Basilia Jumbo PT, DPT 12/27/20 1:18 PM [phone]

## 2020-12-27 NOTE — Progress Notes (Signed)
Initial Nutrition Assessment  DOCUMENTATION CODES:   Not applicable  INTERVENTION:   Glucerna Shake po BID, each supplement provides 220 kcal and 10 grams of protein ProSource Plus 30 ml BID, each supplement provides 100 kcals and 15 grams protein.  MVI with minerals daily   NUTRITION DIAGNOSIS:   Increased nutrient needs related to acute illness as evidenced by estimated needs.  GOAL:   Patient will meet greater than or equal to 90% of their needs  MONITOR:   PO intake, Supplement acceptance, Weight trends, Labs, I & O's  REASON FOR ASSESSMENT:   Malnutrition Screening Tool    ASSESSMENT:   Patient with PMH significant for depression, DM, CKD III, HLD, HTN, osteoporosis, and heard of hard. Presents this admission with severe sepsis from likely urinary source and thyroid nodule.  Attempted to call patient, no answer. Intake this admission documented as 20%, 100%, 90%, and 50% for patients last four meals. RD to provide supplementation to maximize kcal and protein this admission.   Weight shows to be stable over the last year. Will need to obtain NFPE to assess for malnutrition.   Drips: NS @ 75 ml/hr  Medications: calcium-vitamin D, SS novolog, vitamin B12 Labs: Mg 1.5 (L) CBG 91-184  Diet Order:   Diet Order             Diet Carb Modified Fluid consistency: Thin; Room service appropriate? Yes  Diet effective now                   EDUCATION NEEDS:   Not appropriate for education at this time  Skin:  Skin Assessment: Reviewed RN Assessment  Last BM:  8/20  Height:   Ht Readings from Last 1 Encounters:  12/12/20 5\' 4"  (1.626 m)    Weight:   Wt Readings from Last 1 Encounters:  12/27/20 90.7 kg    BMI:  Body mass index is 34.32 kg/m.  Estimated Nutritional Needs:   Kcal:  1700-1900 kcal  Protein:  85-100 grams  Fluid:  >/= 1.7 L/day   12/29/20 MS, RD, LDN, CNSC Clinical Nutrition Pager listed in AMION

## 2020-12-28 LAB — BASIC METABOLIC PANEL
Anion gap: 7 (ref 5–15)
BUN: 17 mg/dL (ref 8–23)
CO2: 29 mmol/L (ref 22–32)
Calcium: 8.1 mg/dL — ABNORMAL LOW (ref 8.9–10.3)
Chloride: 103 mmol/L (ref 98–111)
Creatinine, Ser: 1.17 mg/dL — ABNORMAL HIGH (ref 0.44–1.00)
GFR, Estimated: 46 mL/min — ABNORMAL LOW (ref >60)
Glucose, Bld: 112 mg/dL — ABNORMAL HIGH (ref 70–99)
Potassium: 4.1 mmol/L (ref 3.5–5.1)
Sodium: 139 mmol/L (ref 135–145)

## 2020-12-28 LAB — CBC WITH DIFFERENTIAL/PLATELET
Abs Immature Granulocytes: 0.25 10*3/uL — ABNORMAL HIGH (ref 0.00–0.07)
Basophils Absolute: 0 10*3/uL (ref 0.0–0.1)
Basophils Relative: 0 %
Eosinophils Absolute: 0.1 10*3/uL (ref 0.0–0.5)
Eosinophils Relative: 1 %
HCT: 28.1 % — ABNORMAL LOW (ref 36.0–46.0)
Hemoglobin: 8.8 g/dL — ABNORMAL LOW (ref 12.0–15.0)
Immature Granulocytes: 2 %
Lymphocytes Relative: 12 %
Lymphs Abs: 1.8 10*3/uL (ref 0.7–4.0)
MCH: 27.2 pg (ref 26.0–34.0)
MCHC: 31.3 g/dL (ref 30.0–36.0)
MCV: 87 fL (ref 80.0–100.0)
Monocytes Absolute: 1.4 10*3/uL — ABNORMAL HIGH (ref 0.1–1.0)
Monocytes Relative: 10 %
Neutro Abs: 11.3 10*3/uL — ABNORMAL HIGH (ref 1.7–7.7)
Neutrophils Relative %: 75 %
Platelets: 230 10*3/uL (ref 150–400)
RBC: 3.23 MIL/uL — ABNORMAL LOW (ref 3.87–5.11)
RDW: 18.5 % — ABNORMAL HIGH (ref 11.5–15.5)
WBC: 14.8 10*3/uL — ABNORMAL HIGH (ref 4.0–10.5)
nRBC: 0 % (ref 0.0–0.2)

## 2020-12-28 LAB — URINE CULTURE: Culture: >=100000 — AB

## 2020-12-28 LAB — GLUCOSE, CAPILLARY
Glucose-Capillary: 102 mg/dL — ABNORMAL HIGH (ref 70–99)
Glucose-Capillary: 148 mg/dL — ABNORMAL HIGH (ref 70–99)

## 2020-12-28 LAB — MAGNESIUM: Magnesium: 1.8 mg/dL (ref 1.7–2.4)

## 2020-12-28 MED ORDER — CEFUROXIME AXETIL 500 MG PO TABS: 500.0000 mg | ORAL_TABLET | Freq: Two times a day (BID) | ORAL | 0 refills | Status: AC

## 2020-12-28 NOTE — Progress Notes (Signed)
Discharge instructions given to mike (son) by Frederick Peers, MD. Instructions also provided to pt. by this nurse. Personal belongings returned, dentures, bilateral hearing aids, cell phone ,chargers, personal clothes. PIV removed per orders. Pt. Taken via wheelchair and nurse aids to meet pt. Son, Benjamin, at front entrance

## 2020-12-28 NOTE — TOC Transition Note (Signed)
Transition of Care [facility]) - CM/SW Discharge Note   Patient Details  Name: Kennidy Lamke MRN: [id number] Date of Birth: Dec 11, 1934  [facility] CM/SW Contact:  Allayne Butcher, RN Phone Number: 12/28/2020, 10:24 AM   Clinical Narrative:    Patient medically cleared for discharge home today.  Home Health services arranged with [facility].  Bar[facility] Cower with [facility] notified of discharge today.  Patient's son will come and pick her up.  No DME needs.   Patient declined SNF for rehab.     Final next level of care: Home w Home Health Services Barriers to Discharge: Barriers Resolved   Patient Goals and CMS Choice Patient states their goals for this hospitalization and ongoing recovery are:: wants to go home with home health CMS Medicare.gov Compare Post Acute Care list provided to:: Patient Choice offered to / list presented to : Patient, Adult Children  Discharge Placement                       Discharge Plan and Services   Discharge Planning Services: CM Consult Post Acute Care Choice: Skilled Nursing Facility          DME Arranged: N/A DME Agency: NA       HH Arranged: RN, PT, OT, Nurse's Aide HH Agency: [facility] Date HH Agency Contacted: 12/28/20 Time HH Agency Contacted: 1023 Representative spoke with at [facility] Agency: Barbara Cower  Social Determinants of Health (SDOH) Interventions     Readmission Risk Interventions No flowsheet data found.

## 2020-12-28 NOTE — Plan of Care (Signed)
  Problem: Education: Goal: Knowledge of General Education information will improve Description: Including pain rating scale, medication(s)/side effects and non-pharmacologic comfort measures Outcome: Progressing   Problem: Safety: Goal: Ability to remain free from injury will improve Outcome: Progressing   

## 2020-12-28 NOTE — Discharge Summary (Signed)
Physician Discharge Summary   Briana Cook NID:[id number] DOB: [birth year] DOA: 12/25/2020  PCP: Patient, No Pcp Per (Inactive)  Admit date: 12/25/2020 Discharge date:  12/28/2020  Admitted From: home Disposition:  home with [facility] Discharging physician: Dwyane Dee, MD  Recommendations for Outpatient Follow-up:  Follow up/repeat annual/biennial thyroid ultrasound for nodules  Home Health: PT, OT, RN, aide Equipment/Devices:   Patient discharged to home in Discharge Condition: stable Risk of unplanned readmission score: Unplanned Admission- Pilot do not use: 24.7  CODE STATUS: DNR Diet recommendation:  Diet Orders (From admission, onward)     Start     Ordered   12/25/20 1512  Diet Carb Modified Fluid consistency: Thin; Room service appropriate? Yes  Diet effective now       Question Answer Comment  Diet-HS Snack? Nothing   Calorie Level Medium 1600-2000   Fluid consistency: Thin   Room service appropriate? Yes      12/25/20 1512            Hospital Course: Briana Cook is an 86 year old female with PMH depression, DM II, CKD3a, hyperlipidemia, hypertension, osteoporosis, hard of hearing who presented to the hospital with weakness and a fall. She underwent work-up in the ER which was notable for probable UTI.  Urinalysis was notable for negative nitrites, small leukocyte esterase, greater than 50 WBC, many bacteria, hyaline casts were noted to be present.  She was started on fluids and Rocephin and admitted for further monitoring. Initial lactic acid was also noted to be elevated, 2.8.  See below for further A&P  * Severe sepsis (HCC)-resolved as of 12/28/2020 - Tachycardia, tachypnea, elevated lactic acid, leukocytosis, urinary source -Continue Rocephin - Follow-up urine culture: Growing E. coli, sensitivities reviewed -De-escalated from Rocephin down to cefuroxime to complete course at discharge  Thyroid nodule - Incidental left thyroid nodule noted on CT cervical  spine on admission -Thyroid ultrasound ordered per recommendations.  1 right-sided nodule, 2 left-sided nodules.  Do not meet criteria for biopsy per radiology report.  Annual/biennial ultrasound recommended for follow-up of nodules until stability x5 years confirmed -Also discussed findings with her son Briana Cook) on the phone on 12/27/2020 - Check TSH: Normal, 0.618  Fall at home, initial encounter - fall at home PTA 2/2 weakness - CTH and CT spine negative for bleed or fracture - right forehead bruise noted on exam -Follow-up PT/OT eval's: SNF recommended.  Discussed with her son, Briana Cook.  He states patient may not want to go to rehab and ultimately might go home at discharge.  Will have to continue discussions, if she does go home then will need to arrange for some home health. -Ultimately patient decided to discharge home with home health and declined SNF  Depression - Continue Cymbalta  Chronic kidney disease, stage 3a ([place]) - patient has history of CKD3a. Baseline creat ~ 1 - 1.1, eGFR 46-54   PAF (paroxysmal atrial fibrillation) (HCC) - Continue Eliquis - Continue Cardizem and Toprol  GERD (gastroesophageal reflux disease) - Continue PPI  HLD (hyperlipidemia) - Continue Zocor  HTN (hypertension) - Continue Cardizem and Toprol    The patient's chronic medical conditions were treated accordingly per the patient's home medication regimen except as noted.  On day of discharge, patient was felt deemed stable for discharge. Patient/family member advised to call PCP or come back to ER if needed.   Principal Diagnosis: Severe sepsis [facility])  Discharge Diagnoses: Active Hospital Problems   Diagnosis Date Noted   Thyroid nodule 12/26/2020  Priority: Low   Fall at home, initial encounter 12/26/2020   Chronic kidney disease, stage 3a ([place]) 12/12/2020   PAF (paroxysmal atrial fibrillation) ([place]) 12/12/2020   Depression    HLD (hyperlipidemia) 04/06/2018   HTN (hypertension)  04/06/2018   GERD (gastroesophageal reflux disease) 04/06/2018    Resolved Hospital Problems   Diagnosis Date Noted Date Resolved   Severe sepsis [facility]) 12/25/2020 12/28/2020    Priority: High    Discharge Instructions     Increase activity slowly   Complete by: As directed       Allergies as of 12/28/2020       Reactions   Oxycodone Other (See Comments)   Pt reports "It makes me crazy" Other reaction(s): Hallucination        Medication List     STOP taking these medications    dexamethasone 2 MG tablet Commonly known as: DECADRON       TAKE these medications    acetaminophen 500 MG tablet Commonly known as: TYLENOL Take 1,000 mg by mouth every 8 (eight) hours as needed.   amLODipine 10 MG tablet Commonly known as: NORVASC Take 10 mg by mouth daily.   apixaban 5 MG Tabs tablet Commonly known as: ELIQUIS Take 1 tablet (5 mg total) by mouth 2 (two) times daily.   aspirin EC 81 MG tablet Take 81 mg by mouth daily.   CALCIUM 600+D PO Take 2 tablets by mouth daily.   cefUROXime 500 MG tablet Commonly known as: CEFTIN Take 1 tablet (500 mg total) by mouth 2 (two) times daily with a meal for 2 days.   cetirizine 10 MG tablet Commonly known as: ZYRTEC Take 10 mg by mouth daily as needed.   diltiazem 240 MG 24 hr capsule Commonly known as: CARDIZEM CD Take 1 capsule (240 mg total) by mouth daily.   DULoxetine 60 MG capsule Commonly known as: CYMBALTA Take 60 mg by mouth daily.   Icy Hot 5 % Ptch Generic drug: Menthol Apply 1 patch topically daily as needed (pain).   ICY HOT EX Apply 1 application topically daily as needed (pain).   metoprolol succinate 50 MG 24 hr tablet Commonly known as: TOPROL-XL Take 50 mg by mouth 2 (two) times daily.   pantoprazole 40 MG tablet Commonly known as: PROTONIX Take 40 mg by mouth 2 (two) times daily before a meal.   simvastatin 40 MG tablet Commonly known as: ZOCOR Take 40 mg by mouth daily.   traMADol  50 MG tablet Commonly known as: ULTRAM Take 50-100 mg by mouth every 8 (eight) hours as needed for moderate pain.   vitamin B-12 1000 MCG tablet Commonly known as: CYANOCOBALAMIN Take 1,000 mcg by mouth daily.        Allergies  Allergen Reactions   Oxycodone Other (See Comments)    Pt reports "It makes me crazy" Other reaction(s): Hallucination    Consultations:   Discharge Exam: BP (!) 118/107 (BP Location: Right Arm)   Pulse 100   Temp 98.7 F (37.1 C)   Resp 18   Wt 90.7 kg   SpO2 98%   BMI 34.32 kg/m  General appearance: alert, cooperative, no distress, and hard of hearing Head:  Bruise appreciated on right forehead approximately 3 to 4 cm Eyes:  EOMI, PERRL Lungs: clear to auscultation bilaterally Heart: regular rate and rhythm and S1, S2 normal Abdomen: normal findings: bowel sounds normal and soft, non-tender Extremities:  No edema Skin: mobility and turgor normal Neurologic: No focal  deficits.  Alert and oriented x3  The results of significant diagnostics from this hospitalization (including imaging, microbiology, ancillary and laboratory) are listed below for reference.   Microbiology: Recent Results (from the past 240 hour(s))  Blood culture (routine x 2)     Status: None (Preliminary result)   Collection Time: 12/25/20 11:37 AM   Specimen: BLOOD  Result Value Ref Range Status   Specimen Description BLOOD BLOOD LEFT WRIST  Final   Special Requests   Final    BOTTLES DRAWN AEROBIC AND ANAEROBIC Blood Culture results may not be optimal due to an inadequate volume of blood received in culture bottles   Culture   Final    NO GROWTH 3 DAYS Performed at [facility], [address], [place], [place] [address]    Report Status PENDING  Incomplete  Blood culture (routine x 2)     Status: None (Preliminary result)   Collection Time: 12/25/20 11:42 AM   Specimen: BLOOD  Result Value Ref Range Status   Specimen Description BLOOD BLOOD RIGHT HAND   Final   Special Requests   Final    BOTTLES DRAWN AEROBIC AND ANAEROBIC Blood Culture results may not be optimal due to an inadequate volume of blood received in culture bottles   Culture   Final    NO GROWTH 3 DAYS Performed at [facility], [address] [place], [place] [address]    Report Status PENDING  Incomplete  Urine Culture     Status: Abnormal   Collection Time: 12/25/20 12:19 PM   Specimen: Urine, Random  Result Value Ref Range Status   Specimen Description   Final    URINE, RANDOM Performed at [facility], [address] [place],  [address]    Special Requests   Final    NONE Performed at [facility], [address] [place],  [address]    Culture >=100,000 COLONIES/mL ESCHERICHIA COLI (A)  Final   Report Status 12/28/2020 FINAL  Final   Organism ID, Bacteria ESCHERICHIA COLI (A)  Final      Susceptibility   Escherichia coli - MIC*    AMPICILLIN >=32 RESISTANT Resistant     CEFAZOLIN <=4 SENSITIVE Sensitive     CEFEPIME <=0.12 SENSITIVE Sensitive     CEFTRIAXONE <=0.25 SENSITIVE Sensitive     CIPROFLOXACIN <=0.25 SENSITIVE Sensitive     GENTAMICIN <=1 SENSITIVE Sensitive     IMIPENEM <=0.25 SENSITIVE Sensitive     NITROFURANTOIN <=16 SENSITIVE Sensitive     TRIMETH/SULFA <=20 SENSITIVE Sensitive     AMPICILLIN/SULBACTAM >=32 RESISTANT Resistant     PIP/TAZO <=4 SENSITIVE Sensitive     * >=100,000 COLONIES/mL ESCHERICHIA COLI     Labs: BNP (last 3 results) No results for input(s): BNP in the last 8760 hours. Basic Metabolic Panel: Recent Labs  Lab 12/25/20 1053 12/26/20 0537 12/27/20 0551 12/28/20 0435  NA 139 142 141 139  K 3.6 4.2 3.9 4.1  CL 100 102 106 103  CO2 _0 GLUCOSE 198* 102* 135* 112*  BUN 25* _1 CREATININE 1.25* 0.91 0.89 1.17*  CALCIUM 8.0* 8.0* 8.2* 8.1*  MG  --   --  1.5* 1.8   Liver Function Tests: Recent Labs  Lab 12/25/20 1053  AST 16  ALT 20  ALKPHOS 31*   BILITOT 1.9*  PROT 4.8*  ALBUMIN 2.6*   No results for input(s): LIPASE, AMYLASE in the last 168 hours. No results  for input(s): AMMONIA in the last 168 hours. CBC: Recent Labs  Lab 12/25/20 1053 12/26/20 0537 12/26/20 0538 12/27/20 0551 12/28/20 0435  WBC 24.5* 20.8* 20.7* 18.4* 14.8*  NEUTROABS  --   --  16.8* 14.9* 11.3*  HGB 11.2* 10.7* 10.7* 9.9* 8.8*  HCT 34.8* 34.5* 33.7* 31.3* 28.1*  MCV 86.6 86.7 87.3 87.9 87.0  PLT 315 303 304 245 230   Cardiac Enzymes: No results for input(s): CKTOTAL, CKMB, CKMBINDEX, TROPONINI in the last 168 hours. BNP: Invalid input(s): POCBNP CBG: Recent Labs  Lab 12/27/20 1126 12/27/20 1617 12/27/20 2127 12/28/20 0742 12/28/20 1211  GLUCAP 184* 107* 122* 102* 148*   D-Dimer No results for input(s): DDIMER in the last 72 hours. Hgb A1c No results for input(s): HGBA1C in the last 72 hours. Lipid Profile Recent Labs    12/27/20 0551  CHOL 107  HDL 30*  LDLCALC 50  TRIG 137  CHOLHDL 3.6   Thyroid function studies Recent Labs    12/26/20 0537  TSH 0.618   Anemia work up No results for input(s): VITAMINB12, FOLATE, FERRITIN, TIBC, IRON, RETICCTPCT in the last 72 hours. Urinalysis    Component Value Date/Time   COLORURINE YELLOW 12/25/2020 1038   APPEARANCEUR HAZY (A) 12/25/2020 1038   LABSPEC 1.020 12/25/2020 1038   PHURINE 7.0 12/25/2020 1038   GLUCOSEU NEGATIVE 12/25/2020 1038   HGBUR SMALL (A) 12/25/2020 1038   BILIRUBINUR NEGATIVE 12/25/2020 1038   KETONESUR NEGATIVE 12/25/2020 1038   PROTEINUR 100 (A) 12/25/2020 1038   NITRITE NEGATIVE 12/25/2020 1038   LEUKOCYTESUR SMALL (A) 12/25/2020 1038   Sepsis Labs Invalid input(s): PROCALCITONIN,  WBC,  LACTICIDVEN Microbiology Recent Results (from the past 240 hour(s))  Blood culture (routine x 2)     Status: None (Preliminary result)   Collection Time: 12/25/20 11:37 AM   Specimen: BLOOD  Result Value Ref Range Status   Specimen Description BLOOD BLOOD LEFT  WRIST  Final   Special Requests   Final    BOTTLES DRAWN AEROBIC AND ANAEROBIC Blood Culture results may not be optimal due to an inadequate volume of blood received in culture bottles   Culture   Final    NO GROWTH 3 DAYS Performed at [facility], [address] [place], [place] [address]    Report Status PENDING  Incomplete  Blood culture (routine x 2)     Status: None (Preliminary result)   Collection Time: 12/25/20 11:42 AM   Specimen: BLOOD  Result Value Ref Range Status   Specimen Description BLOOD BLOOD RIGHT HAND  Final   Special Requests   Final    BOTTLES DRAWN AEROBIC AND ANAEROBIC Blood Culture results may not be optimal due to an inadequate volume of blood received in culture bottles   Culture   Final    NO GROWTH 3 DAYS Performed at [facility], [address], [place], [place] [address]    Report Status PENDING  Incomplete  Urine Culture     Status: Abnormal   Collection Time: 12/25/20 12:19 PM   Specimen: Urine, Random  Result Value Ref Range Status   Specimen Description   Final    URINE, RANDOM Performed at [facility], [address], [place], [place] [address]    Special Requests   Final    NONE Performed at [facility], [address] [place], [place] [address]    Culture >=100,000 COLONIES/mL ESCHERICHIA COLI (A)  Final   Report Status 12/28/2020 FINAL  Final   Organism ID, Bacteria ESCHERICHIA COLI (A)  Final      Susceptibility   Escherichia coli - MIC*    AMPICILLIN >=32 RESISTANT Resistant     CEFAZOLIN <=4 SENSITIVE Sensitive     CEFEPIME <=0.12 SENSITIVE Sensitive     CEFTRIAXONE <=0.25 SENSITIVE Sensitive     CIPROFLOXACIN <=0.25 SENSITIVE Sensitive     GENTAMICIN <=1 SENSITIVE Sensitive     IMIPENEM <=0.25 SENSITIVE Sensitive     NITROFURANTOIN <=16 SENSITIVE Sensitive     TRIMETH/SULFA <=20 SENSITIVE Sensitive     AMPICILLIN/SULBACTAM >=32 RESISTANT Resistant     PIP/TAZO <=4 SENSITIVE  Sensitive     * >=100,000 COLONIES/mL ESCHERICHIA COLI    Procedures/Studies: CT HEAD WO CONTRAST (5MM)  Result Date: 12/25/2020 CLINICAL DATA:  Dizziness last night. Patient fell. Bruising to both temporal areas. EXAM: CT HEAD WITHOUT CONTRAST CT CERVICAL SPINE WITHOUT CONTRAST TECHNIQUE: Multidetector CT imaging of the head and cervical spine was performed following the standard protocol without intravenous contrast. Multiplanar CT image reconstructions of the cervical spine were also generated. COMPARISON:  None. FINDINGS: CT HEAD FINDINGS Brain: No evidence of acute infarction, hemorrhage, hydrocephalus, extra-axial collection or mass lesion/mass effect. Patchy areas of white matter hypoattenuation are noted consistent with mild chronic microvascular ischemic change. Vascular: No hyperdense vessel or unexpected calcification. Skull: Normal. Negative for fracture or focal lesion. Sinuses/Orbits: Globes and orbits are unremarkable. Sinuses are clear. Other: Right lateral frontal scalp contusion/hematoma. CT CERVICAL SPINE FINDINGS Alignment: Normal. Skull base and vertebrae: No acute fracture. No primary bone lesion or focal pathologic process. Soft tissues and spinal canal: No prevertebral fluid or swelling. No visible canal hematoma. Disc levels: Mild loss of disc height at C5-C6 and C6-C7. Mild facet degenerative change on the right at C2-C3 and C3-C4 and on the left at C3-C4. No disc herniation or significant stenosis. Upper chest: No acute findings.  1.5 cm left thyroid nodule. Other: None. IMPRESSION: HEAD CT 1. No acute intracranial abnormalities. 2. Right lateral frontal scalp contusion/hematoma. No skull fracture. CERVICAL CT 1. No fracture or acute finding. 2. 1.5 cm left thyroid nodule. Recommend thyroid US (ref: J Am Coll Radiol. 2015 Feb;12(2): 143-50). Electronically Signed   By: Lajean Manes M.D.   On: 12/25/2020 12:08   CT Cervical Spine Wo Contrast  Result Date: 12/25/2020 CLINICAL  DATA:  Dizziness last night. Patient fell. Bruising to both temporal areas. EXAM: CT HEAD WITHOUT CONTRAST CT CERVICAL SPINE WITHOUT CONTRAST TECHNIQUE: Multidetector CT imaging of the head and cervical spine was performed following the standard protocol without intravenous contrast. Multiplanar CT image reconstructions of the cervical spine were also generated. COMPARISON:  None. FINDINGS: CT HEAD FINDINGS Brain: No evidence of acute infarction, hemorrhage, hydrocephalus, extra-axial collection or mass lesion/mass effect. Patchy areas of white matter hypoattenuation are noted consistent with mild chronic microvascular ischemic change. Vascular: No hyperdense vessel or unexpected calcification. Skull: Normal. Negative for fracture or focal lesion. Sinuses/Orbits: Globes and orbits are unremarkable. Sinuses are clear. Other: Right lateral frontal scalp contusion/hematoma. CT CERVICAL SPINE FINDINGS Alignment: Normal. Skull base and vertebrae: No acute fracture. No primary bone lesion or focal pathologic process. Soft tissues and spinal canal: No prevertebral fluid or swelling. No visible canal hematoma. Disc levels: Mild loss of disc height at C5-C6 and C6-C7. Mild facet degenerative change on the right at C2-C3 and C3-C4 and on the left at C3-C4. No disc herniation or significant stenosis. Upper chest: No acute findings.  1.5 cm left thyroid  nodule. Other: None. IMPRESSION: HEAD CT 1. No acute intracranial abnormalities. 2. Right lateral frontal scalp contusion/hematoma. No skull fracture. CERVICAL CT 1. No fracture or acute finding. 2. 1.5 cm left thyroid nodule. Recommend thyroid US (ref: J Am Coll Radiol. 2015 Feb;12(2): 143-50). Electronically Signed   By: Lajean Manes M.D.   On: 12/25/2020 12:08   DG Chest Port 1 View  Result Date: 12/25/2020 CLINICAL DATA:  Weakness EXAM: PORTABLE CHEST 1 VIEW COMPARISON:  Thirteen days ago FINDINGS: Chronic cardiomegaly. Improved aeration. No consolidation, edema,  effusion, or pneumothorax. 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 Extensive artifact from EKG leads. IMPRESSION: No evidence of active disease. Electronically Signed   By: Monte Fantasia M.D.   On: 12/25/2020 11:10   DG Chest Portable 1 View  Result Date: 12/12/2020 CLINICAL DATA:  Dyspnea EXAM: PORTABLE CHEST 1 VIEW COMPARISON:  04/06/2018 FINDINGS: The lungs are well expanded. There are patchy pulmonary infiltrates within the peripheral right mid and lower lung zone similar in appearance to prior examination possibly representing a small amount of post inflammatory scarring given its stability over time. No definite superimposed acute pulmonary infiltrate. No pneumothorax. Small left pleural effusion is suspected. Mild cardiomegaly is stable. Pulmonary vascularity is normal. Right total shoulder arthroplasty has been performed. Aunts degenerative changes are seen within the left shoulder. IMPRESSION: Probable post inflammatory scarring within the right lung. No definite acute cardiopulmonary disease detected. Stable cardiomegaly. Electronically Signed   By: Fidela Salisbury MD   On: 12/12/2020 13:41   ECHOCARDIOGRAM COMPLETE  Result Date: 12/13/2020    ECHOCARDIOGRAM REPORT   Patient Name:   DANYETTA GILLHAM Date of Exam: 12/13/2020 Medical Rec #:  [id number]         Height:       64.0 in Accession #:    [id number]        Weight:       200.0 lb Date of Birth:  Jul 24, 1934         BSA:          1.956 m Patient Age:    33 years          BP:           151/68 mmHg Patient Gender: F                 HR:           82 bpm. Exam Location:  [facility] Procedure: 2D Echo, Color Doppler and Cardiac Doppler Indications:     I48.91 Atrial fibrillation  History:         Patient has prior history of Echocardiogram examinations.                  Signs/Symptoms:Palpitations; Risk Factors:Dyslipidemia. Pt                  tested positive for COVID-19 on 12/12/20.  Sonographer:     Charmayne Sheer RDCS  (AE) Referring Phys:  [id number] Collier Bullock Diagnosing Phys: Bartholome Bill MD  Sonographer Comments: Suboptimal parasternal window and suboptimal subcostal window. IMPRESSIONS  1. Left ventricular ejection fraction, by estimation, is 30 to 35%. The left ventricle has moderately decreased function. The left ventricle demonstrates global hypokinesis. Left ventricular diastolic parameters were normal.  2. Right ventricular systolic function is normal. The right ventricular size is normal.  3. Left atrial size was mildly dilated.  4. Right atrial size was mildly dilated.  5. The mitral valve  is grossly normal. Mild mitral valve regurgitation.  6. The aortic valve was not well visualized. Aortic valve regurgitation is trivial. FINDINGS  Left Ventricle: Left ventricular ejection fraction, by estimation, is 30 to 35%. The left ventricle has moderately decreased function. The left ventricle demonstrates global hypokinesis. The left ventricular internal cavity size was normal in size. There is no left ventricular hypertrophy. Left ventricular diastolic parameters were normal. Right Ventricle: The right ventricular size is normal. No increase in right ventricular wall thickness. Right ventricular systolic function is normal. Left Atrium: Left atrial size was mildly dilated. Right Atrium: Right atrial size was mildly dilated. Pericardium: There is no evidence of pericardial effusion. Mitral Valve: The mitral valve is grossly normal. Mild mitral valve regurgitation. MV peak gradient, 8.0 mmHg. The mean mitral valve gradient is 3.0 mmHg. Tricuspid Valve: The tricuspid valve is grossly normal. Tricuspid valve regurgitation is trivial. Aortic Valve: The aortic valve was not well visualized. Aortic valve regurgitation is trivial. Aortic valve mean gradient measures 3.0 mmHg. Aortic valve peak gradient measures 6.4 mmHg. Aortic valve area, by VTI measures 3.02 cm. Pulmonic Valve: The pulmonic valve was not well visualized. Pulmonic  valve regurgitation is mild. Aorta: The aortic root is normal in size and structure. IAS/Shunts: The interatrial septum was not well visualized.  LEFT VENTRICLE PLAX 2D LVIDd:         6.30 cm  Diastology LVIDs:         5.50 cm  LV e' lateral:   8.27 cm/s LV PW:         1.20 cm  LV E/e' lateral: 12.5 LV IVS:        0.80 cm LVOT diam:     2.20 cm LV SV:         60 LV SV Index:   31 LVOT Area:     3.80 cm  RIGHT VENTRICLE RV Basal diam:  3.60 cm LEFT ATRIUM             Index       RIGHT ATRIUM           Index LA diam:        4.70 cm 2.40 cm/m  RA Area:     22.10 cm LA Vol (A2C):   49.1 ml 25.10 ml/m RA Volume:   61.80 ml  31.59 ml/m LA Vol (A4C):   41.6 ml 21.26 ml/m LA Biplane Vol: 45.3 ml 23.16 ml/m  AORTIC VALVE                   PULMONIC VALVE AV Area (Vmax):    3.00 cm    PV Vmax:       0.84 m/s AV Area (Vmean):   3.28 cm    PV Vmean:      56.200 cm/s AV Area (VTI):     3.02 cm    PV VTI:        0.117 m AV Vmax:           126.00 cm/s PV Peak grad:  2.8 mmHg AV Vmean:          79.300 cm/s PV Mean grad:  1.0 mmHg AV VTI:            0.199 m AV Peak Grad:      6.4 mmHg AV Mean Grad:      3.0 mmHg LVOT Vmax:         99.60 cm/s LVOT Vmean:  68.400 cm/s LVOT VTI:          0.158 m LVOT/AV VTI ratio: 0.79  AORTA Ao Root diam: 3.00 cm MITRAL VALVE MV Area (PHT): 5.79 cm     SHUNTS MV Area VTI:   1.96 cm     Systemic VTI:  0.16 m MV Peak grad:  8.0 mmHg     Systemic Diam: 2.20 cm MV Mean grad:  3.0 mmHg MV Vmax:       1.41 m/s MV Vmean:      77.1 cm/s MV Decel Time: 131 msec MV E velocity: 103.40 cm/s Bartholome Bill MD Electronically signed by Bartholome Bill MD Signature Date/Time: 12/13/2020/3:04:55 PM    Final    US THYROID  Result Date: 12/27/2020 CLINICAL DATA:  Nodule on CT cervical spine EXAM: THYROID ULTRASOUND TECHNIQUE: Ultrasound examination of the thyroid gland and adjacent soft tissues was performed. COMPARISON:  CT previous day FINDINGS: Parenchymal Echotexture: Moderately heterogenous Isthmus:  0.3 cm thickness Right lobe: 3.9 x 1.1 x 1.7 cm Left lobe: 3.12.1 cm _________________________________________________________ Estimated total number of nodules >/= 1 cm: 3 Number of spongiform nodules >/=  2 cm not described below (TR1): 0 Number of mixed cystic and solid nodules >/= 1.5 cm not described below (TR2): 0 _________________________________________________________ Nodule # 1: Location: Right; mid Maximum size: 2.3 cm; Other 2 dimensions: 1.5 x 0.8 cm Composition: mixed cystic and solid (1) Echogenicity: hypoechoic (2) Shape: not taller-than-wide (0) Margins: smooth (0) Echogenic foci: none (0) ACR TI-RADS total points: 3. ACR TI-RADS risk category: TR3 (3 points). ACR TI-RADS recommendations: *Given size (>/= 1.5 - 2.4 cm) and appearance, a follow-up ultrasound in 1 year should be considered based on TI-RADS criteria. _________________________________________________________ Nodule # 2: Location: Left; mid Maximum size: 2 cm; Other 2 dimensions: 1.9 x 1.5 cm Composition: mixed cystic and solid (1) Echogenicity: hypoechoic (2) Shape: not taller-than-wide (0) Margins: smooth (0) Echogenic foci: none (0) ACR TI-RADS total points: 3. ACR TI-RADS risk category: TR3 (3 points). ACR TI-RADS recommendations: *Given size (>/= 1.5 - 2.4 cm) and appearance, a follow-up ultrasound in 1 year should be considered based on TI-RADS criteria. _________________________________________________________ Nodule # 3: Location: Left; medial Maximum size: 1.3 cm; Other 2 dimensions: 0.9 x 0.6 cm Composition: solid/almost completely solid (2) Echogenicity: hypoechoic (2) Shape: not taller-than-wide (0) Margins: smooth (0) Echogenic foci: none (0) ACR TI-RADS total points: 4. ACR TI-RADS risk category: TR4 (4-6 points). ACR TI-RADS recommendations: *Given size (>/= 1 - 1.4 cm) and appearance, a follow-up ultrasound in 1 year should be considered based on TI-RADS criteria. _________________________________________________________  No regional cervical adenopathy identified. IMPRESSION: 1. Normal-sized thyroid with bilateral nodules. None meets criteria for biopsy. 2. Recommend annual/biennial ultrasound follow-up of nodules as above, until stability x5 years confirmed. The above is in keeping with the ACR TI-RADS recommendations - J Am Coll Radiol 2017;14:587-595. Electronically Signed   By: Lucrezia Europe M.D.   On: 12/27/2020 07:34     Time coordinating discharge: Over 30 minutes    Dwyane Dee, MD  [facility] 12/28/2020, 1:50 PM

## 2020-12-28 NOTE — Care Management Important Message (Signed)
Important Message  Patient Details  Name: Briana Cook MRN: [id number] Date of Birth: [birth year]   Medicare Important Message Given:  N/A - LOS <3 / Initial given by admissions     [name] 12/28/2020, 10:05 AM

## 2020-12-30 LAB — CULTURE, BLOOD (ROUTINE X 2)
Culture: NO GROWTH
Culture: NO GROWTH

## 2022-03-29 IMAGING — CT CT CERVICAL SPINE W/O CM
3 of 4 series · 12 of 33 positions shown, 14 images · non-contrast
Comparison: None.

CLINICAL DATA: Dizziness last night. Patient fell. Bruising to both
temporal areas.

EXAM:
CT HEAD WITHOUT CONTRAST
CT CERVICAL SPINE WITHOUT CONTRAST
TECHNIQUE: Multidetector CT imaging of the head and cervical spine was
performed following the standard protocol without intravenous
contrast. Multiplanar CT image reconstructions of the cervical spine
were also generated.

[Series 6: sagittal bone · sagittal · 0.25mm/px · 5 of 71 slices shown, 6 images]
[im 24/71  bone]
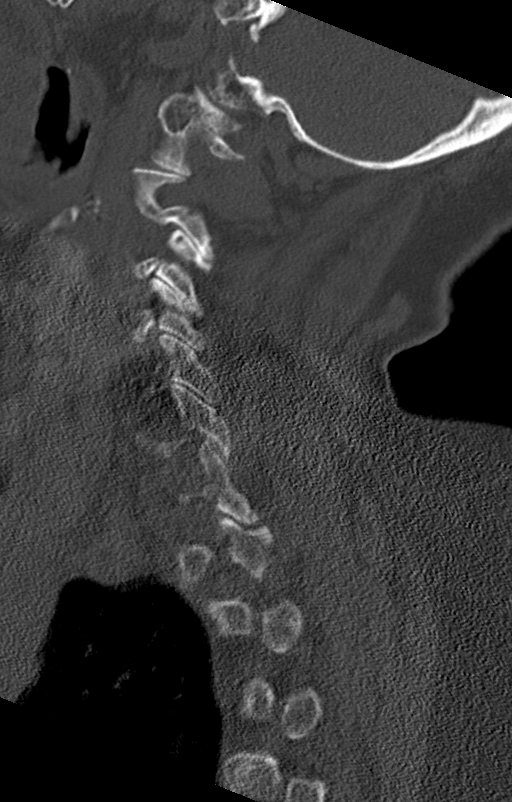
[im 30/71  bone]
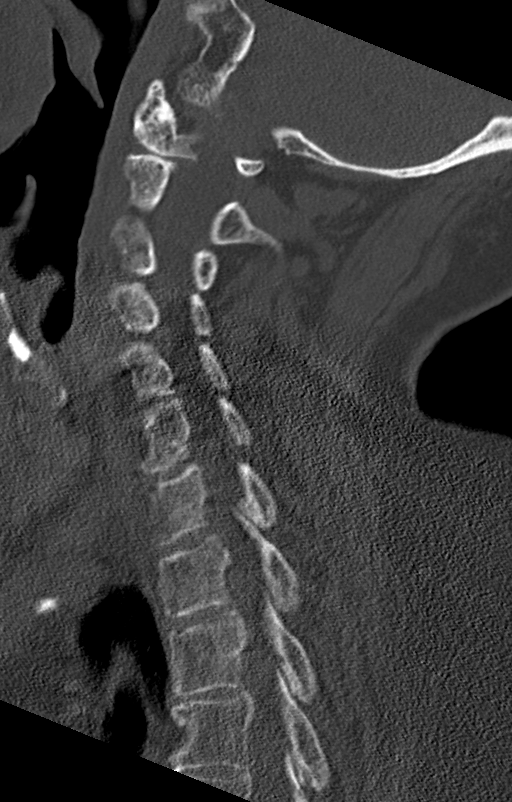
[im 36/71  soft-tissue]
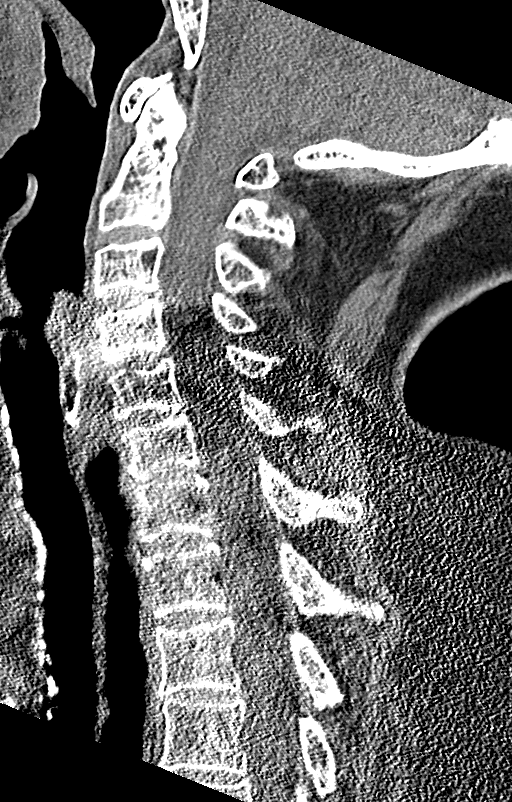
[im 36/71  bone]
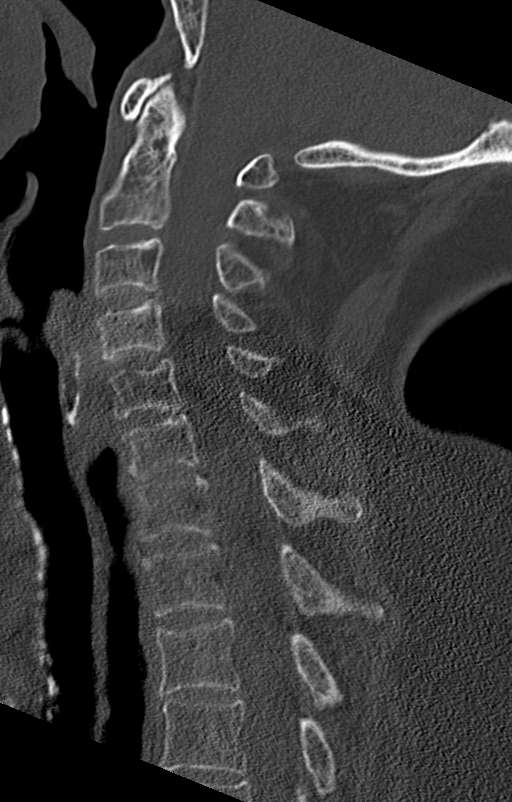
[im 41/71  bone]
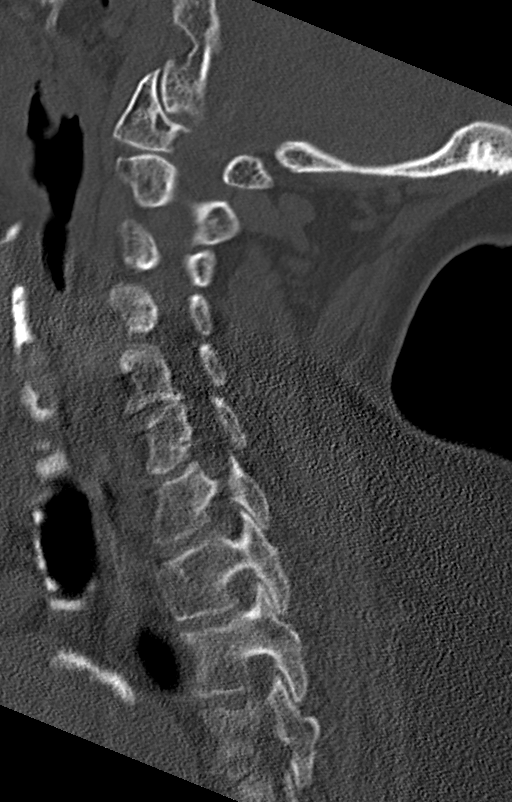
[im 47/71  bone]
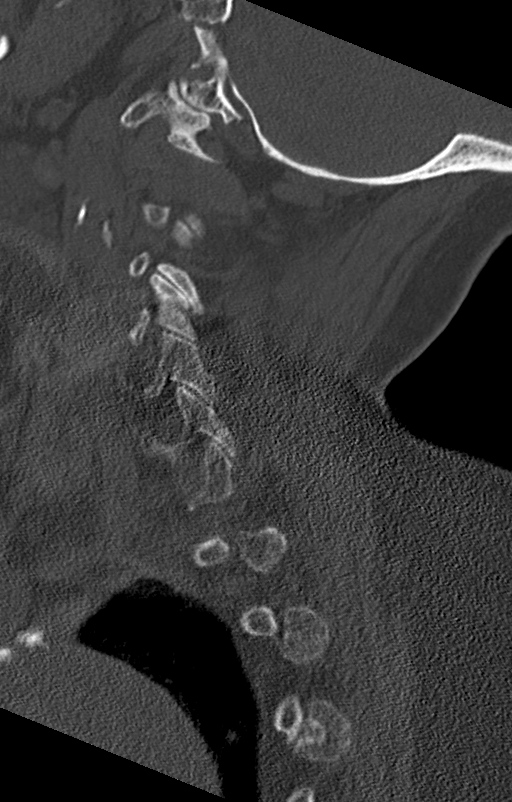

[Series 7: coronal bone · coronal · 0.28mm/px · 3 of 65 slices shown]
[im 13/65  bone]
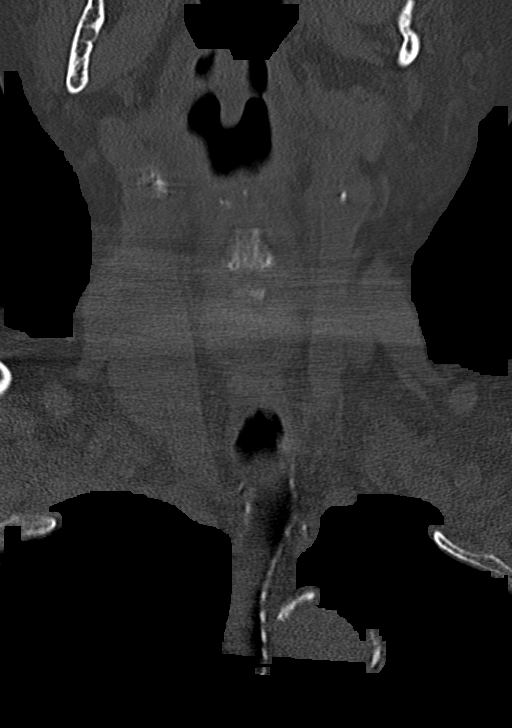
[im 26/65  bone]
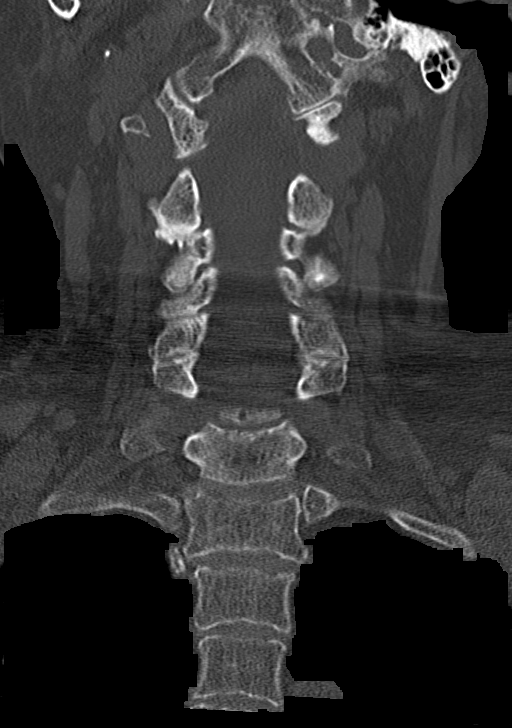
[im 39/65  bone]
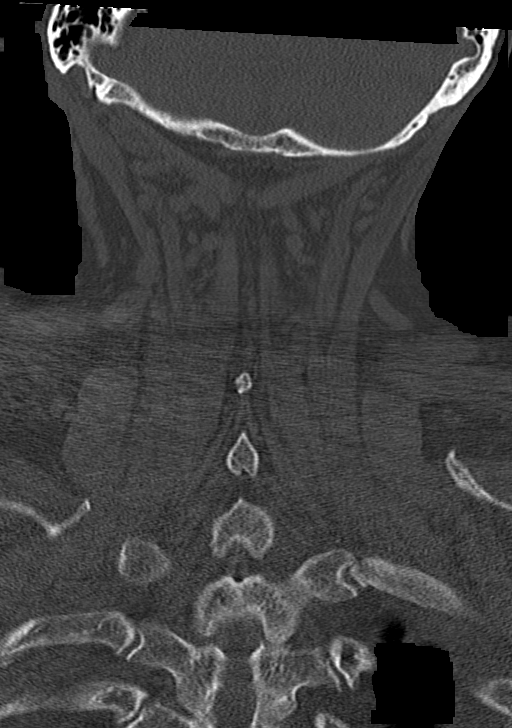

[Series 8: orthogonal bone · axial · 0.25mm/px · z∈[-316,-190]mm · 4 of 102 slices shown, 5 images]
[im 17/102  soft-tissue]
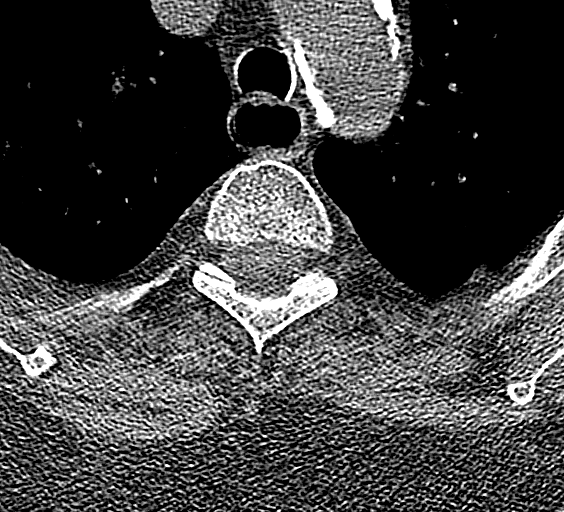
[im 17/102  bone]
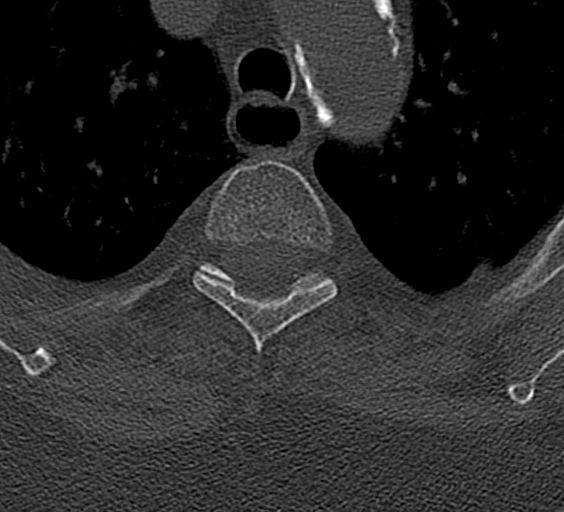
[im 34/102  bone]
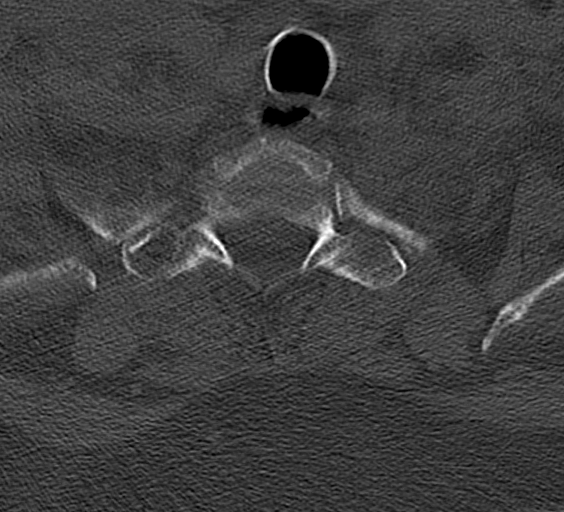
[im 68/102  bone]
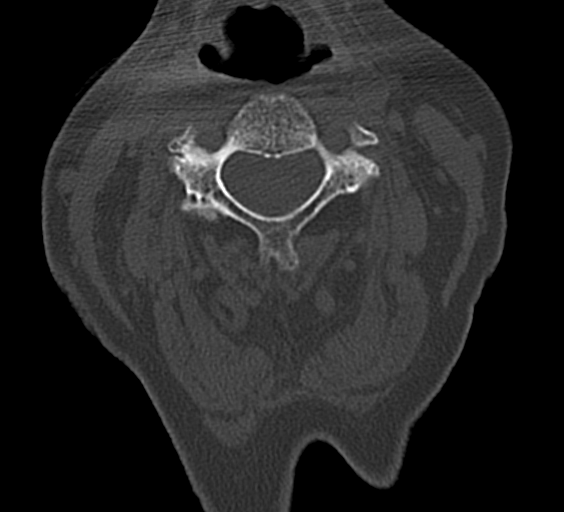
[im 85/102  bone]
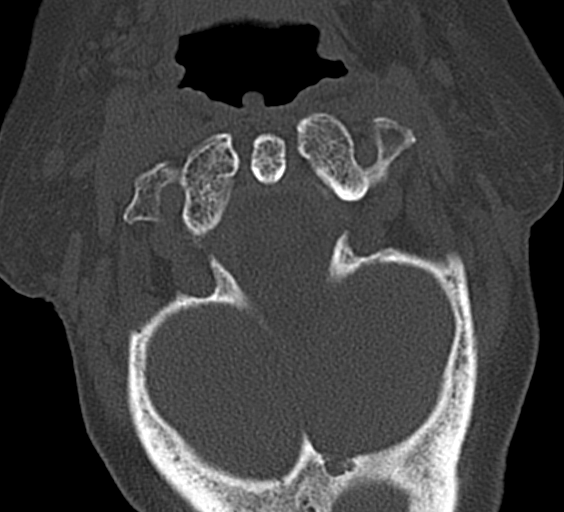

[12 of 33 positions shown; findings below may reference images not displayed]

FINDINGS: CT HEAD FINDINGS

Brain: No evidence of acute infarction, hemorrhage, hydrocephalus,
extra-axial collection or mass lesion/mass effect.

Patchy areas of white matter hypoattenuation are noted consistent
with mild chronic microvascular ischemic change.

Vascular: No hyperdense vessel or unexpected calcification.

Skull: Normal. Negative for fracture or focal lesion.

Sinuses/Orbits: Globes and orbits are unremarkable. Sinuses are
clear.

Other: Right lateral frontal scalp contusion/hematoma.

CT CERVICAL SPINE FINDINGS

Alignment: Normal.

Skull base and vertebrae: No acute fracture. No primary bone lesion
or focal pathologic process.

Soft tissues and spinal canal: No prevertebral fluid or swelling. No
visible canal hematoma.

Disc levels: Mild loss of disc height at C5-C6 and C6-C7. Mild facet
degenerative change on the right at C2-C3 and C3-C4 and on the left
at C3-C4. No disc herniation or significant stenosis.

Upper chest: No acute findings.  1.5 cm left thyroid nodule.

Other: None.
IMPRESSION: HEAD CT

1. No acute intracranial abnormalities.
2. Right lateral frontal scalp contusion/hematoma. No skull
fracture.

CERVICAL CT

1. No fracture or acute finding.
2. 1.5 cm left thyroid nodule. Recommend thyroid US (ref: [HOSPITAL]. [DATE]): 143-50).

## 2022-03-30 IMAGING — US US THYROID
1 series · 13 of 25 positions shown · non-contrast
Comparison: CT previous day

CLINICAL DATA: Nodule on CT cervical spine

EXAM:
THYROID ULTRASOUND
TECHNIQUE: Ultrasound examination of the thyroid gland and adjacent soft
tissues was performed.

[Series 1: us thyroid · 13 of 73 slices shown]
[im 1/73]
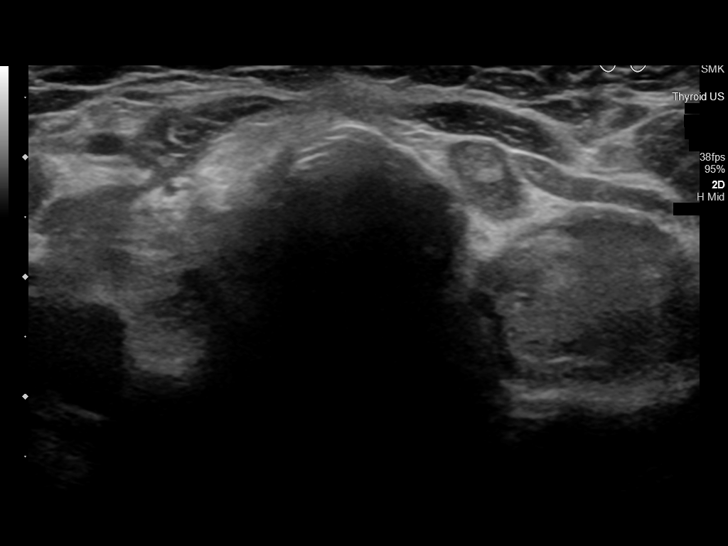
[im 7/73]
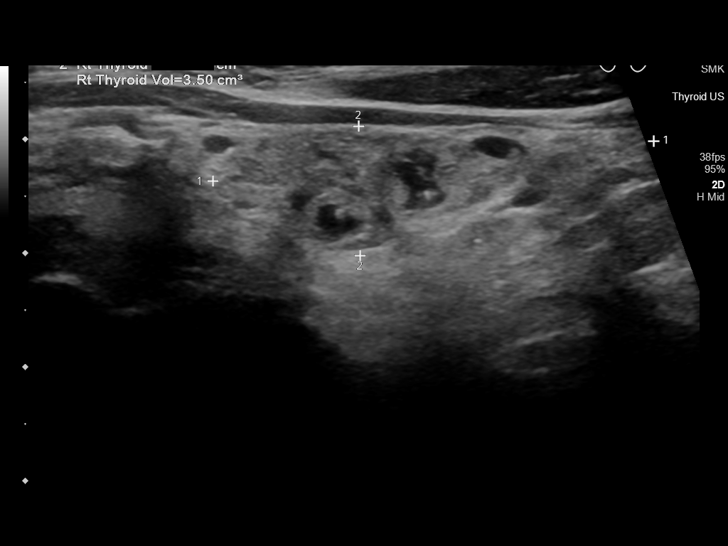
[im 13/73]
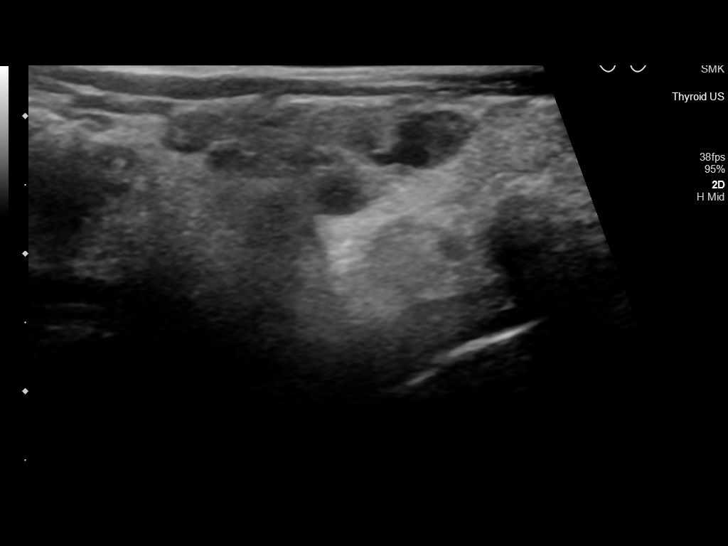
[im 19/73]
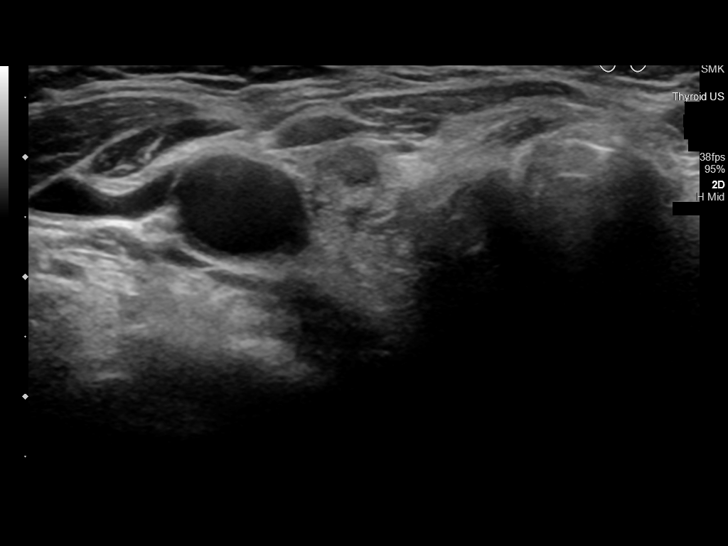
[im 25/73]
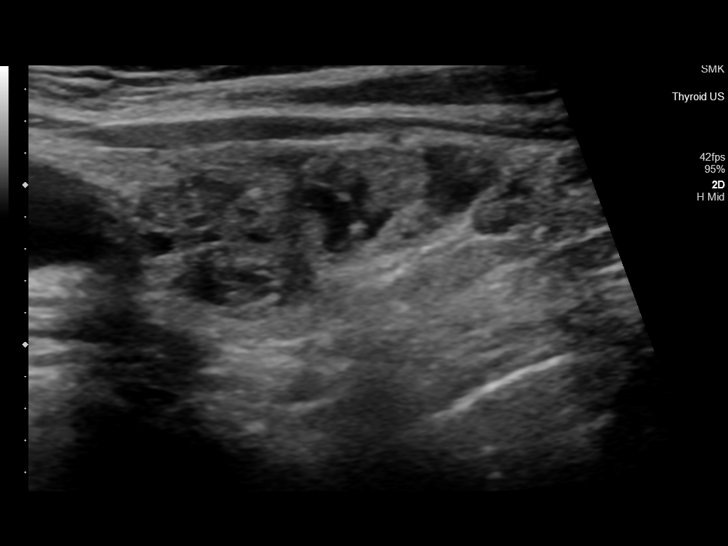
[im 31/73]
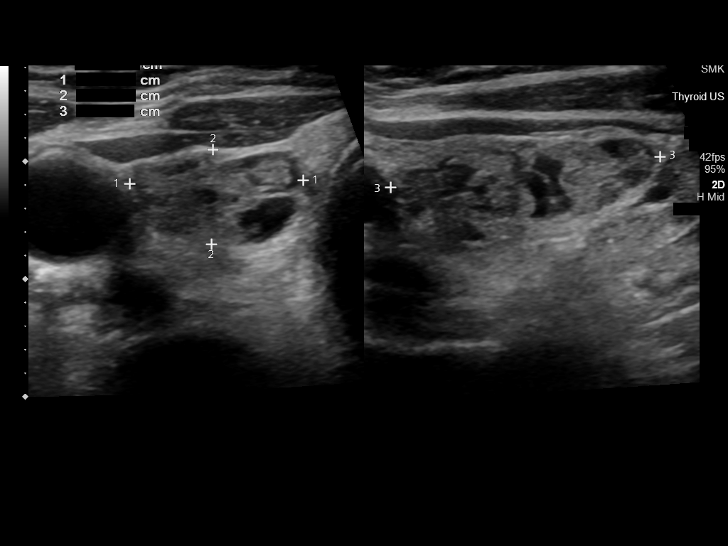
[im 37/73]
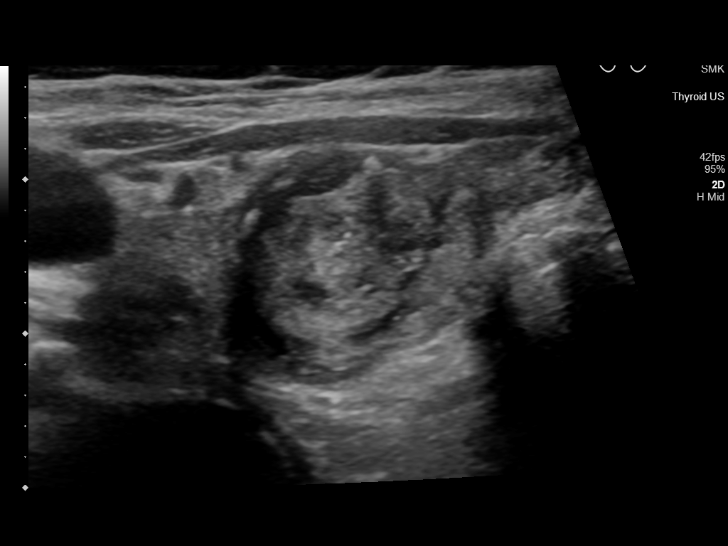
[im 43/73]
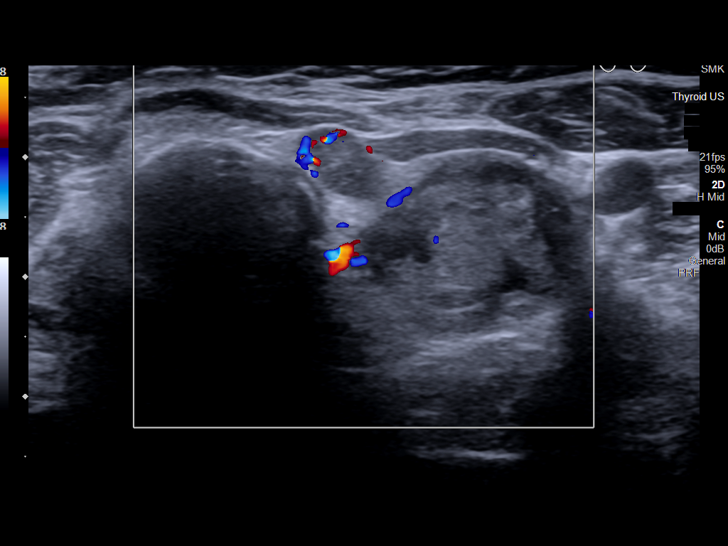
[im 49/73]
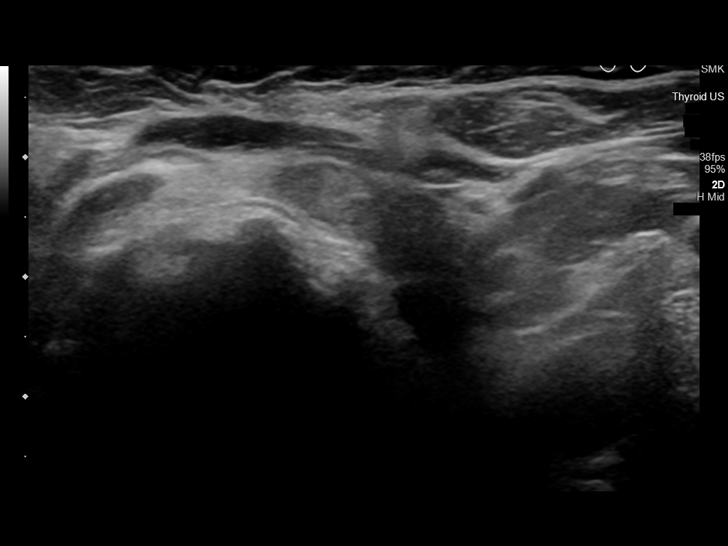
[im 55/73]
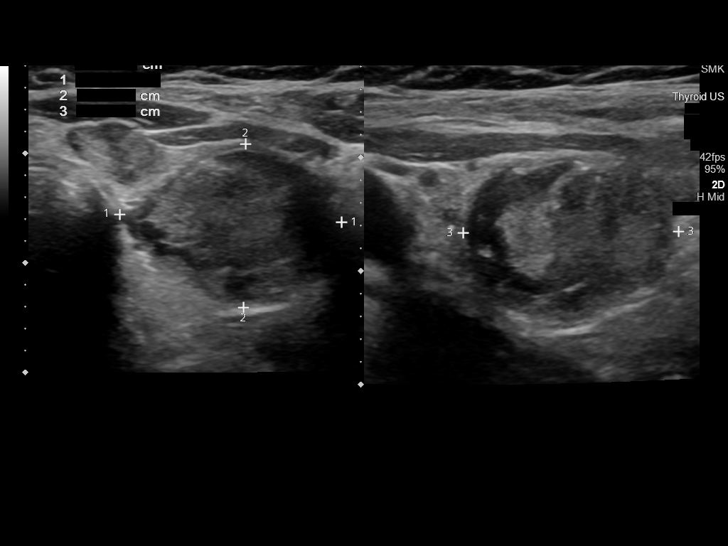
[im 61/73]
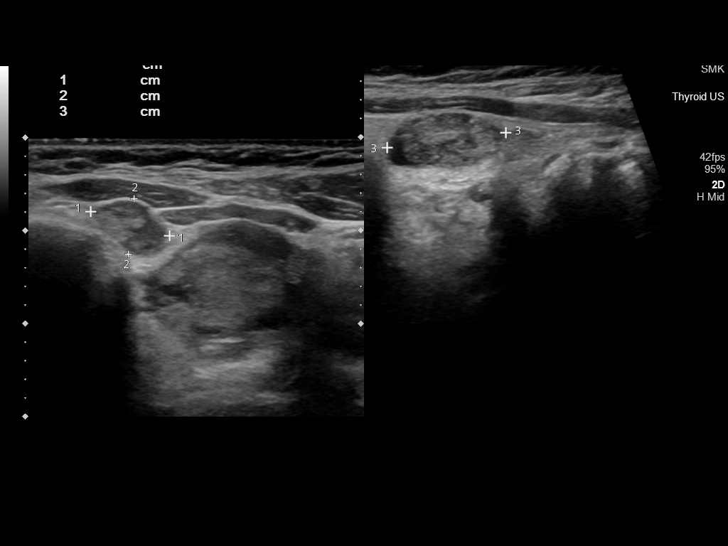
[im 67/73]
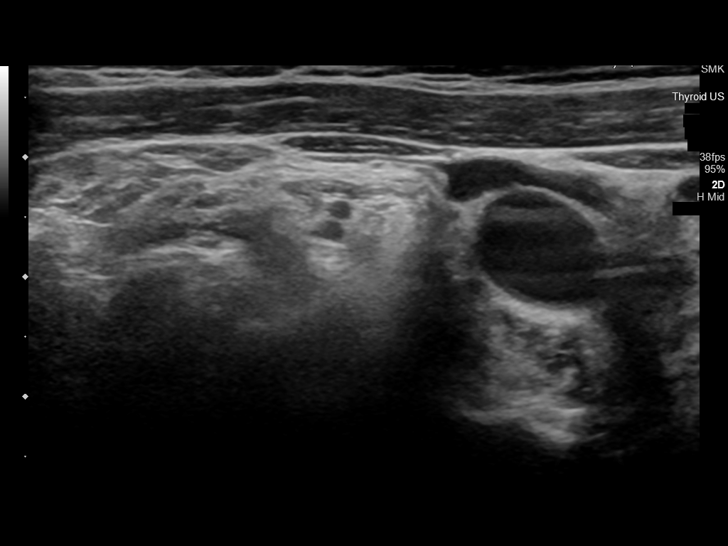
[im 73/73]
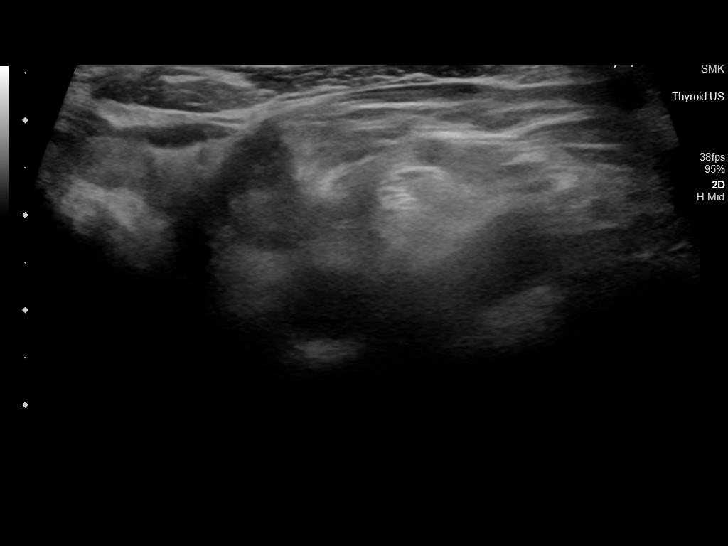

[13 of 25 positions shown; findings below may reference images not displayed]

FINDINGS: Parenchymal Echotexture: Moderately heterogenous

Isthmus: 0.3 cm thickness

Right lobe: 3.9 x 1.1 x 1.7 cm

Left lobe: 3.12.1 cm

_________________________________________________________

Estimated total number of nodules >/= 1 cm: 3

Number of spongiform nodules >/=  2 cm not described below (TR1): 0

Number of mixed cystic and solid nodules >/= 1.5 cm not described
below (TR2): 0

_________________________________________________________

Nodule # 1:

Location: Right; mid

Maximum size: 2.3 cm; Other 2 dimensions: 1.5 x 0.8 cm

Composition: mixed cystic and solid (1)

Echogenicity: hypoechoic (2)

Shape: not taller-than-wide (0)

Margins: smooth (0)

Echogenic foci: none (0)

ACR TI-RADS total points: 3.

ACR TI-RADS risk category: TR3 (3 points).

ACR TI-RADS recommendations:

*Given size (>/= 1.5 - 2.4 cm) and appearance, a follow-up
ultrasound in 1 year should be considered based on TI-RADS criteria.

_________________________________________________________

Nodule # 2:

Location: Left; mid

Maximum size: 2 cm; Other 2 dimensions: 1.9 x 1.5 cm

Composition: mixed cystic and solid (1)

Echogenicity: hypoechoic (2)

Shape: not taller-than-wide (0)

Margins: smooth (0)

Echogenic foci: none (0)

ACR TI-RADS total points: 3.

ACR TI-RADS risk category: TR3 (3 points).

ACR TI-RADS recommendations:

*Given size (>/= 1.5 - 2.4 cm) and appearance, a follow-up
ultrasound in 1 year should be considered based on TI-RADS criteria.

_________________________________________________________

Nodule # 3:

Location: Left; medial

Maximum size: 1.3 cm; Other 2 dimensions: 0.9 x 0.6 cm

Composition: solid/almost completely solid (2)

Echogenicity: hypoechoic (2)

Shape: not taller-than-wide (0)

Margins: smooth (0)

Echogenic foci: none (0)

ACR TI-RADS total points: 4.

ACR TI-RADS risk category: TR4 (4-6 points).

ACR TI-RADS recommendations:

*Given size (>/= 1 - 1.4 cm) and appearance, a follow-up ultrasound
in 1 year should be considered based on TI-RADS criteria.

_________________________________________________________

No regional cervical adenopathy identified.
IMPRESSION: 1. Normal-sized thyroid with bilateral nodules. None meets criteria
for biopsy.
2. Recommend annual/biennial ultrasound follow-up of nodules as
above, until stability x5 years confirmed.

The above is in keeping with the ACR TI-RADS recommendations - [HOSPITAL] [id number];[DATE].

## 2022-11-15 ENCOUNTER — Other Ambulatory Visit
Admission: RE | Admit: 2022-11-15 | Discharge: 2022-11-15 | Disposition: A | Discharge status: Home / Self Care | Payer: Medicare Other | Source: Ambulatory Visit | Attending: Nurse Practitioner | Admitting: Nurse Practitioner

## 2022-11-15 DIAGNOSIS — I5022 Chronic systolic (congestive) heart failure: Secondary | ICD-10-CM | POA: Insufficient documentation

## 2022-11-15 DIAGNOSIS — I10 Essential (primary) hypertension: Secondary | ICD-10-CM | POA: Insufficient documentation

## 2022-11-15 LAB — BRAIN NATRIURETIC PEPTIDE: B Natriuretic Peptide: 392.2 pg/mL — ABNORMAL HIGH (ref 0.0–100.0)

## 2022-11-22 ENCOUNTER — Other Ambulatory Visit
Admission: RE | Admit: 2022-11-22 | Discharge: 2022-11-22 | Disposition: A | Discharge status: Home / Self Care | Payer: Medicare Other | Source: Ambulatory Visit | Attending: Nurse Practitioner | Admitting: Nurse Practitioner

## 2022-11-22 DIAGNOSIS — I5022 Chronic systolic (congestive) heart failure: Secondary | ICD-10-CM | POA: Diagnosis present

## 2022-11-22 DIAGNOSIS — I10 Essential (primary) hypertension: Secondary | ICD-10-CM | POA: Insufficient documentation

## 2022-11-22 LAB — BRAIN NATRIURETIC PEPTIDE: B Natriuretic Peptide: 464.7 pg/mL — ABNORMAL HIGH (ref 0.0–100.0)

## 2022-12-07 ENCOUNTER — Other Ambulatory Visit
Admission: RE | Admit: 2022-12-07 | Discharge: 2022-12-07 | Disposition: A | Discharge status: Home / Self Care | Payer: Medicare Other | Source: Ambulatory Visit | Attending: Nurse Practitioner | Admitting: Nurse Practitioner

## 2022-12-07 DIAGNOSIS — I5022 Chronic systolic (congestive) heart failure: Secondary | ICD-10-CM | POA: Diagnosis present

## 2022-12-07 LAB — BRAIN NATRIURETIC PEPTIDE: B Natriuretic Peptide: 312.4 pg/mL — ABNORMAL HIGH (ref 0.0–100.0)

## 2022-12-18 ENCOUNTER — Other Ambulatory Visit
Admission: RE | Admit: 2022-12-18 | Discharge: 2022-12-18 | Disposition: A | Discharge status: Home / Self Care | Payer: Medicare Other | Source: Ambulatory Visit | Attending: Nurse Practitioner | Admitting: Nurse Practitioner

## 2022-12-18 DIAGNOSIS — I5022 Chronic systolic (congestive) heart failure: Secondary | ICD-10-CM | POA: Insufficient documentation

## 2022-12-18 LAB — BRAIN NATRIURETIC PEPTIDE: B Natriuretic Peptide: 232.1 pg/mL — ABNORMAL HIGH (ref 0.0–100.0)

## 2023-01-04 ENCOUNTER — Encounter: Payer: Self-pay | Admitting: Gerontology

## 2023-01-04 DIAGNOSIS — S40011D Contusion of right shoulder, subsequent encounter: Secondary | ICD-10-CM

## 2023-01-11 ENCOUNTER — Other Ambulatory Visit: Payer: Self-pay | Admitting: Gerontology

## 2023-01-11 ENCOUNTER — Encounter: Payer: Self-pay | Admitting: Gerontology

## 2023-01-11 DIAGNOSIS — S40011D Contusion of right shoulder, subsequent encounter: Secondary | ICD-10-CM

## 2023-01-15 ENCOUNTER — Ambulatory Visit
Admission: RE | Admit: 2023-01-15 | Discharge: 2023-01-15 | Disposition: A | Discharge status: Home / Self Care | Payer: Medicare Other | Source: Ambulatory Visit | Attending: Gerontology | Admitting: Gerontology

## 2023-01-15 DIAGNOSIS — S40011D Contusion of right shoulder, subsequent encounter: Secondary | ICD-10-CM

## 2023-01-23 ENCOUNTER — Other Ambulatory Visit
Admission: RE | Admit: 2023-01-23 | Discharge: 2023-01-23 | Disposition: A | Discharge status: Home / Self Care | Payer: Medicare Other | Source: Ambulatory Visit | Attending: Nurse Practitioner | Admitting: Nurse Practitioner

## 2023-01-23 DIAGNOSIS — I5022 Chronic systolic (congestive) heart failure: Secondary | ICD-10-CM | POA: Diagnosis present

## 2023-01-23 LAB — BRAIN NATRIURETIC PEPTIDE: B Natriuretic Peptide: 265.4 pg/mL — ABNORMAL HIGH (ref 0.0–100.0)

## 2024-03-08 NOTE — Discharge Summary (Signed)
 ------------------------------------------------------------------------------- Attestation with edits by Wynn Rolland Hun, MD at 03/08/24 1246 Briana Cook is currently being treated primarily for Recurrent falls , acute  I saw and evaluated the patient, participating in the key portions of the service.  I reviewed the resident's note.  I agree with the resident's findings and plan.  Rolland KATHEE Wynn, MD  -------------------------------------------------------------------------------   Physician Discharge Summary [facility] 4 BT1 [facility] [address] [place] [address] Dept: [phone] Loc: [phone]   Identifying Information:  Briana Cook 08-05-1934 [id number]  Primary Care Physician: [facility]   Code Status: DNR and DNI  Admit Date: 03/06/2024  Discharge Date: 03/08/2024   Discharge To: Home with Home Health and/or PT/OT  Discharge Service: [facility] - Geriatrics Floor Team ([facility])   Discharge Attending Physician: Rolland Hun Wynn, MD  Discharge Diagnoses:  Principal Problem:   Recurrent falls (POA: Not Applicable) Active Problems:   Anxiety (POA: Yes)   Depression (POA: Yes)   HFrEF (heart failure with reduced ejection fraction) (CMS-HCC) (POA: Yes)   HTN (hypertension) (POA: Yes)   HLD (hyperlipidemia) (POA: Yes)   Paroxysmal A-fib (CMS-HCC) (POA: Yes)   UTI (urinary tract infection) (POA: Unknown)   Syncope (POA: Unknown)   Subclinical hyperthyroidism (POA: Unknown)   Chronic kidney disease (CKD), stage 3 (CMS-HCC) (POA: Unknown) Resolved Problems:   * No resolved hospital problems. *   Outpatient Provider Follow Up Issues:   [ ]  Subclinical Hyperthyroidism, repeat thyroid  labs in 4-6 weeks [ ]  Ortho follow-up 1 week after discharge for right shoulder [ ]  Referral for cardiology in [place] placed for ZioPatch for 14 days [ ]  determine if lasix needs to be restarted   Hospital Course:   Briana Cook is a 89 y.o. female  with pertinent PMHx of atrial fibrillation (on Eliquis ), HFrEF (LVEF previously 45%), HTN, HLD, T2DM, MDD presenting with recurrent falls with syncope. Also found to have UTI. The etiology of her presentation was likely multifactorial, including orthostatic hypotension (possibly related to decreased oral intake and dehydration from UTI), vasovagal syncope, vs potentially arrhythmia even though less likely.   Active Problems   Recurrent Falls with Syncope [name] presented after multiple falls with possible syncope. Workup revealed a urinary tract infection; other infectious studies were negative. Cardiac evaluation showed negative troponin, normal CK, and EKG with known atrial fibrillation and incomplete right bundle branch block, without ischemic changes. Telemetry showed infrequent 1-2 second pauses. CT head was normal. Chest X-ray showed small bilateral pleural effusions and bibasilar atelectasis. Echocardiogram demonstrated a small pericardial effusion and LVEF of 55%, improved from 45% in February 2025. The etiology of her presentation was likely multifactorial, including orthostatic hypotension (possibly related to decreased oral intake and dehydration from UTI, polypharmacy), vasovagal syncope, vs potentially arrhythmia even though less likely. Outpatient referral placed for 14-day Zio patch for outpatient rhythm monitoring. PT/OT recommended 5 times low intensity, but the patient and family wanted her to return home. She will go home with PT/OT services.   UTI UA with large LE, many WBC, and bacteria c/f UTI. Urine culture showing greater than 100,000 CFU Aerococcus urinae. Switch from CTX q24h (10/30-10/31) to amoxicillin 500 mg p.o. every 8 hours (10/31 - 11/03).   Chronic Shoulder Pain s/p R total athroplasty Right shoulder X-ray demonstrated anterior dislocation and malalignment of the shoulder prosthesis, which was more pronounced compared to the CT from 07/03/23 but not a new finding.  Orthopedics was consulted in the ED; no intervention was required at this time.  [name] was scheduled for Tylenol  and lidocaine  patches as needed. She will follow up with orthopedics one week after discharge.   Subclinical Hyperthyroidism TSH 0.258 (low) with normal T4 1.75. TSH was previously normal in May 2025. Patient without signs or symptoms of hyperthyroidism. Follow up outpatient.   CKD3 Creatinine 1.42 on admission. Per chart review, her creatinine has been 1.38-2.07 over the last year so this appears to be consistent with her baseline. Daily BMP while in the hospital.  Avoided nephrotoxic agents.   Atrial Fibrillation [name] is currently in atrial fibrillation with adequate rate control. She is to continue Eliquis  2.5 mg twice daily and metoprolol  25 mg once daily.   HFrEF Pro-BNP 1572 which is relatively close to prior. She has 1+ pitting edema on exam which is her baseline. No other clinical signs or symptoms of volume overload. Followed by [facility]. Repeat echocardiogram completed in ED showed normal LV size with normal LV function, EF 55%. RV not well visualized but likely normal in size and systolic function. Trivial percardial effusion present. EF is improved from prior (45%). Discontinue spironolactone and losartan at discharge to prevent orthostasis (PCP had also previously recommend she stop these) and held lasix 10 mg daily.  Chronic Problems   HTN: Continue amlodipine  5 mg at discharge. Consider restarting outpatient.  HLD: Continue home simvastatin . T2DM: Last A1c 6.1 on 11/13/23. Not currently on medical management, goal A1c<8%. GERD: Continue protonix  40 mg every morning.  MDD: Continue Wellbutrin 300mg . De-escalated Cymbalta  to 60mg  daily (patient takes 80 mg at home).   Touchbase with Outpatient Provider: Warm Handoff: Completed on 03/08/24 by Burnard Pacini, MD  (Resident) via [facility]  Procedures: None ______________________________________________________________________ Discharge Medications:    Your Medication List     PAUSE taking these medications    furosemide 20 MG tablet Wait to take this until your doctor or other care provider tells you to start again. Commonly known as: LASIX Take 1/2 tablet (10 mg total) by mouth daily       STOP taking these medications    famotidine 20 MG tablet Commonly known as: PEPCID   losartan 25 MG tablet Commonly known as: COZAAR   spironolactone 25 MG tablet Commonly known as: ALDACTONE       START taking these medications    amoxicillin 500 MG capsule Commonly known as: AMOXIL Take 1 capsule (500 mg total) by mouth every eight (8) hours for 8 days.       CHANGE how you take these medications    DULoxetine  60 MG capsule Commonly known as: CYMBALTA  Take 1 capsule (60 mg total) by mouth daily.   metoPROLOL  succinate 50 MG 24 hr tablet Commonly known as: Toprol -XL Take 0.5 tablets (25 mg total) by mouth daily. What changed:  how much to take additional instructions       CONTINUE taking these medications    acetaminophen  500 MG tablet Commonly known as: TYLENOL  EXTRA STRENGTH Take 2 tablets (1,000 mg total) by mouth every eight (8) hours as needed for pain. I recommend taking a dose every morning to help with mobility, and then as needed through the day. Max 3,000 mg per day   amlodipine  5 MG tablet Commonly known as: NORVASC  Take 1 tablet (5 mg total) by mouth daily.   AZO CRANBERRY 250 mg Chew Generic drug: cranberry fruit concentrate Chew 2 tablets  in the morning.   buPROPion 300 MG 24 hr tablet Commonly known as: Wellbutrin XL Take 1 tablet (300  mg total) by mouth daily.   calcium  carbonate-vit D3-min 600 mg-10 mcg (400 unit) Tab Take 1 tablet by mouth daily.   cetirizine 10 MG tablet Commonly known as: ZYRTEC Take 1 tablet by mouth daily as needed.   cyanocobalamin   (vitamin B-12) 1000 MCG tablet Take 1 tablet (1,000 mcg total) by mouth daily.   ELIQUIS  5 mg Tab Generic drug: apixaban  Take 0.5 tablets (2.5 mg total) by mouth two (2) times a day.   melatonin 3 mg Tab Take 1 tablet (3 mg total) by mouth every evening.   pantoprazole  40 MG tablet Commonly known as: Protonix  Take 1 tablet (40 mg total) by mouth daily before breakfast.   simvastatin  20 MG tablet Commonly known as: ZOCOR  Take 0.5 tablets (10 mg total) by mouth in the morning.        Allergies: Oxycontin [oxycodone] ______________________________________________________________________ Pending Test Results:   Most Recent Labs: All lab results last 24 hours -  Recent Results (from the past 24 hours)  ECG 12 Lead   Collection Time: 03/08/24  6:41 AM  Result Value Ref Range   EKG Systolic BP  mmHg   EKG Diastolic BP  mmHg   EKG Ventricular Rate 73 BPM   EKG Atrial Rate  BPM   EKG P-R Interval  ms   EKG QRS Duration 98 ms   EKG Q-T Interval 370 ms   EKG QTC Calculation 407 ms   EKG Calculated P Axis  degrees   EKG Calculated R Axis 10 degrees   EKG Calculated T Axis 264 degrees   QTC Fredericia 395 ms    Relevant Studies/Radiology: ECG 12 Lead Result Date: 03/08/2024 ATRIAL FIBRILLATION INCOMPLETE RIGHT BUNDLE BRANCH BLOCK ST & T WAVE ABNORMALITY, CONSIDER INFEROLATERAL ISCHEMIA ABNORMAL ECG WHEN COMPARED WITH ECG OF 06-Mar-2024 12:28, CRITERIA FOR SEPTAL INFARCT  ARE NO LONGER PRESENT  CT head WO contrast Result Date: 03/06/2024 EXAM: Computed tomography, head or brain without contrast material. ACCESSION: [id number] UN CLINICAL INDICATION: 89 years old Female with fall on blood thinners  COMPARISON: CT head dated 09/22/2023 TECHNIQUE: Axial CT images of the head from skull base to vertex without contrast. FINDINGS: Frontal predominant mild global cerebral volume loss with ex vacuo prominence of the CSF-containing spaces. Mild periventricular and deep white matter  hypoattenuation, likely representing chronic small vessel ischemic changes. There is no midline shift. No mass lesion. There is no evidence of large territory acute infarct.  The sinuses are pneumatized. Hyperostosis frontalis interna. Atherosclerotic vascular calcifications. No intracranial hemorrhage or skull fractures.   No acute appearing intracranial abnormalities.   Echocardiogram W Colorflow Spectral Doppler With Contrast Result Date: 03/06/2024 Patient Info Name:     Aleli Navedo Age:     44 years DOB:     1934-09-20 Gender:     Female MRN:     [id number] Accession #:     [id number] UN Account #:     [id number] Ht:     168 cm Wt:     82 kg BSA:     1.97 m2 BP:     157 /     95 mmHg HR:     62 bpm Heart Rhythm:     Atrial Fibrillation Exam Date:     03/06/2024 1:55 PM Admit Date:     03/06/2024 Exam Type:     ECHOCARDIOGRAM W COLORFLOW SPECTRAL DOPPLER W CONTRAST Technical Quality:     Poor Reason for Poor Study:     poor echocardiographic windows Staff  Referring Physician:     Unknown Per Patient Referring Reading Fellow:     Waddell Armin Satchel MD Ordering Physician:     Damien CROME May Study Info Indications      - H/o CHF,  dehydration. ?artifact v.s. clot in transit v.s. other in RA on POCUS ; Definity/Optison Summary   1. Technically difficult study.   2. The left ventricle is normal in size with mildly increased wall thickness.   3. The left ventricular systolic function is normal, LVEF is visually estimated at 55%.   4. The right ventricle is not well visualized but probably normal in size, with probably normal systolic function.   5. There is a trivial pericardial effusion.   6. Compared to study dated 07/04/23, LVEF has slightly improved.   7. The right atrium is not well visualized in this study. Left Ventricle   The left ventricle is normal in size with mildly increased wall thickness. The left ventricular systolic function is normal, LVEF is visually estimated at 55%. Left ventricular  diastolic function cannot be accurately assessed. Right Ventricle   The right ventricle is not well visualized but probably normal in size, with probably normal systolic function. Left Atrium   The left atrium is dilated in size. Right Atrium   The right atrium is not well visualized. Aortic Valve   The aortic valve is trileaflet with mildly thickened leaflets with normal excursion. There is no significant aortic regurgitation. Mitral Valve   The mitral valve leaflets are mildly thickened with normal leaflet mobility. There is mild mitral valve regurgitation. Tricuspid Valve   The tricuspid valve leaflets are normal, with normal leaflet mobility. There is no significant tricuspid regurgitation. The pulmonary systolic pressure cannot be estimated due to insufficient TR signal. Pulmonic Valve   The pulmonic valve is poorly visualized, but probably normal. There is mild pulmonic regurgitation. Aorta   The aorta is normal in size in the visualized segments. Inferior Vena Cava   The IVC is not well visualized precluding the ability to accurate assess right atrial pressure. Pericardium/Pleural   There is a trivial pericardial effusion. Other Findings   Rhythm: Atrial Fibrillation. Ventricles ---------------------------------------------------------------------- Name                                 Value        Normal ---------------------------------------------------------------------- LV Dimensions 2D/MM ----------------------------------------------------------------------  IVS Diastolic Thickness (2D)                                1.2 cm       0.6-0.9 LVID Diastole (2D)                  5.2 cm       3.8-5.2  LVPW Diastolic Thickness (2D)                                1.1 cm       0.6-0.9 LVID Systole (2D)                   3.5 cm       2.2-3.5 LV Mass Index (2D Cubed)          119 g/m2         43-95  Relative Wall Thickness (2D)  0.42        <=0.42 Atria  ---------------------------------------------------------------------- Name                                 Value        Normal ---------------------------------------------------------------------- LA Dimensions ---------------------------------------------------------------------- LA Dimension (2D)                   4.9 cm       2.7-3.8 LA Volume Index (4C A-L)        39.47 ml/m2               LA Volume Index (2C A-L)        69.27 ml/m2               LA Volume (BP MOD)                  101 ml               LA Volume Index (BP MOD)        51.22 ml/m2   16.00-34.00 Aortic Valve ---------------------------------------------------------------------- Name                                 Value        Normal ---------------------------------------------------------------------- AV Doppler ---------------------------------------------------------------------- AV Peak Velocity                   1.3 m/s               AV Peak Gradient                    7 mmHg Mitral Valve ---------------------------------------------------------------------- Name                                 Value        Normal ---------------------------------------------------------------------- MV Diastolic Function ---------------------------------------------------------------------- MV E Peak Velocity                 87 cm/s Tricuspid Valve ---------------------------------------------------------------------- Name                                 Value        Normal ---------------------------------------------------------------------- TV Regurgitation Doppler ---------------------------------------------------------------------- TR Peak Velocity                   2.4 m/s Pulmonic Valve ---------------------------------------------------------------------- Name                                 Value        Normal ---------------------------------------------------------------------- PV Doppler  ---------------------------------------------------------------------- PV Peak Velocity                   1.1 m/s Aorta ---------------------------------------------------------------------- Name                                 Value        Normal ---------------------------------------------------------------------- Ascending Aorta ---------------------------------------------------------------------- Ao Root Diameter (2D)               3.1 cm  Ao Root Diam Index (2D)          1.6 cm/m2               Ascending Aorta Diameter            2.9 cm Venous ---------------------------------------------------------------------- Name                                 Value        Normal ---------------------------------------------------------------------- IVC/SVC ---------------------------------------------------------------------- IVC Diameter (Exp 2D)               1.6 cm         <=2.1 Report Signatures Finalized by Devere JAYSON Banks on 03/06/2024 03:48 PM Resident Waddell Armin Satchel  MD on 03/06/2024 03:29 PM  ECG 12 Lead Result Date: 03/06/2024 ATRIAL FIBRILLATION INCOMPLETE RIGHT BUNDLE BRANCH BLOCK SEPTAL INFARCT NONSPECIFIC ST AND T WAVE ABNORMALITY ABNORMAL ECG WHEN COMPARED WITH ECG OF 06-Mar-2024 10:05, SEPTAL INFARCT  IS NOW PRESENT Confirmed by Lennie Cough [phone]) on 03/06/2024 1:49:47 PM  ECG 12 Lead Result Date: 03/06/2024 BASELINE ARTIFACT ATRIAL FIBRILLATION NONSPECIFIC ST AND T WAVE ABNORMALITY ABNORMAL ECG WHEN COMPARED WITH ECG OF 22-Sep-2023 03:17, NO SIGNIFICANT CHANGE WAS FOUND Confirmed by Lennie Cough [phone]) on 03/06/2024 1:33:26 PM  XR Chest Portable Result Date: 03/06/2024 EXAM: XR CHEST PORTABLE ACCESSION: [id number] UN REPORT DATE: 03/06/2024 11:16 AM CLINICAL INDICATION: SYNCOPE AND COLLAPSE  TECHNIQUE: Single View AP Chest Radiograph. COMPARISON: XR CHEST PORTABLE 09/21/2023 FINDINGS: Hypoinflated lungs with bibasilar atelectasis. Small bilateral pleural effusions.  No pneumothorax. Unchanged prominent cardiomediastinal silhouette. Tortuous thoracic aorta with calcifications. Extensive degenerative changes in left shoulder.   Small bilateral pleural effusions with bibasilar atelectasis.   XR Shoulder 3 Or More Views Right Result Date: 03/06/2024 EXAM: XR SHOULDER 3 OR MORE VIEWS RIGHT DATE: 03/06/2024 11:13 AM ACCESSION: [id number] UN DICTATED: 03/06/2024 11:14 AM INTERPRETATION LOCATION: [facility] CLINICAL INDICATION: 89 years old Female with right shoulder deformity    COMPARISON: CT chest 07/03/2023, shoulder radiograph 01/01/2023 TECHNIQUE: AP, Grashey, outlet, and axillary views of the right shoulder. FINDINGS: There is a right total shoulder arthroplasty with malalignment of the humeral component which is anteriorly displaced relative to the glenoid. Multiple metallic densities overlie the lateral and posterior soft tissues. No fracture is seen. Acromioclavicular narrowing with osteophyte formation.   Malalignment of the right total shoulder arthroplasty with anterior dislocation. Findings slightly more pronounced than on the prior study in 2024 though there is persistent malalignment on the CT 07/03/2023.  ED POCUS Result Date: 03/06/2024 Limited Cardiac Ultrasound (CPT [phone]) Indication: A focused ultrasound exam of the heart was performed to evaluate for pericardial effusion, tamponade, severe hypovolemia, or gross abnormalities of cardiac anatomy or function in this patient. The ultrasound was performed with the following indications, as noted in the H&P: Syncope Identified structures: The pericardial sac, myocardium, and 4 chambers were identified using the following views: parasternal long axis, parasternal short axis, and apical 4-chamber Findings: Exam of the above structures revealed the following findings:  Pericardial effusion: Absent   Pericardial tamponade: N/A  Global LV function: Reduced, mild to moderate  Right ventricular size: Normal    Signs of RV strain: N/A  IVC: N/A, patient has poor toleration of US , will defer at this time in clinical presentation of dehydration  Right atrium: questionable artifact vs clot/tricuspid valve abnormality on apical 4 chamber view.  Limitations: None. Impression: No sonographic evidence of significant pericardial  effusion and Global ventricular function:  Reduced. Will order formal Echo to investigate apical 4 chamber finding of right atrium. Interpreted by: Damien CROME May, MD  ECG 12 Lead Result Date: 03/06/2024 ATRIAL FIBRILLATION MARKED ST ABNORMALITY, POSSIBLE INFERIOR SUBENDOCARDIAL INJURY ABNORMAL ECG WHEN COMPARED WITH ECG OF 22-Sep-2023 03:17, T WAVE INVERSION NOW EVIDENT IN INFERIOR LEADS  ______________________________________________________________________ Discharge Instructions:  Activity Instructions     Activity as tolerated         Why was I admitted?  You came to the hospital after you fell several times and may have fainted.  You was found to have a urinary tract infection (UTI). Tests showed your heart and brain looked okay, and there were no signs of a stroke or seizure.  Your heart rhythm was checked, and you has a known heart condition called atrial fibrillation. We think your falls and fainting were likely caused by several things, including low blood pressure from not drinking enough fluids and the UTI. We cannot rule out that there was possibly a brief heart rhythm problem so we will have more heart monitoring at home and you will follow up with your doctors.    Medications:  Your new medication list is found in the After Visit Summary below.  If there are any additions, changes, or medications removed they are noted in this summary.     If you receive pre-packed Adherence Packaging (Bubble Packs) from your home pharmacy, please show this medication list to your home pharmacist so that they can update any changes that have been made.    For a list of side effects or  what to expect with your medications, you may access medlineplus at: [email or web address].     Who do I contact if I have a question?  If you have specific questions about these instructions, your medications or your hospital stay in the next 48 hours, please call [phone] and ask for the geriatrics doctor on call. Let them know that you were just discharged from the hospital.     For new symptoms or problems, call your primary care provider.     If you believe that this is a life-threatening emergency, call 911 or go to the emergency room.   Thank you for allowing us  to participate in your care.  Follow Up instructions and Outpatient Referrals    Ambulatory Referral to Cardiology     Reason for referral: ZioPatch placement   Specific Service Requested: General Cardiology   Ambulatory Referral to Home Health     Reason for referral: Deconditioning   Is this a [facility] or [facility] Patient?: Yes   Home Health Options: Traditional Home Health   Is this patient at high risk for COVID 19 transmission and recommended to  stay at home during this pandemic?: No   If the patient has a diagnosis of heart failure and is already on oral  diuretics, do you want to activate the in home IV Lasix protocol?: No   Do you want agency provider parameter notifications or patient specific  provider parameter notifications?: Agency   Do you want to initiate remote patient monitoring?: Yes   Physician to follow patient's care: Referring Provider   Disciplines requested:  Physical Therapy Occupational Therapy     Physical Therapy requested: Evaluate and treat   Occupational Therapy Requested: Evaluate and treat   Requested SOC Date:  Comment - 48 hrs after discharge   Call MD for:  difficulty breathing, headache or visual disturbances  Call MD for:  persistent nausea or vomiting     Call MD for:  severe uncontrolled pain     Call MD for:  temperature >38.5 Celsius     Discharge  instructions        ______________________________________________________________________ Discharge Day Services: BP 174/81   Pulse 58   Temp 36.1 C (97 F) (Temporal)   Resp 18   Ht 167.6 cm (5' 5.98)   Wt 82.7 kg (182 lb 5.1 oz)   SpO2 97%   BMI 29.44 kg/m   Pt seen on the day of discharge and determined appropriate for discharge.  Condition at Discharge: stable  Length of Discharge: I spent greater than 30 mins in the discharge of this patient.

## 2024-03-31 ENCOUNTER — Emergency Department

## 2024-03-31 ENCOUNTER — Emergency Department
Admission: EM | Admit: 2024-03-31 | Discharge: 2024-03-31 | Disposition: A | Discharge status: Home / Self Care | Attending: Emergency Medicine | Admitting: Emergency Medicine

## 2024-03-31 ENCOUNTER — Other Ambulatory Visit: Payer: Self-pay

## 2024-03-31 DIAGNOSIS — S2242XA Multiple fractures of ribs, left side, initial encounter for closed fracture: Secondary | ICD-10-CM | POA: Insufficient documentation

## 2024-03-31 DIAGNOSIS — W19XXXA Unspecified fall, initial encounter: Secondary | ICD-10-CM | POA: Insufficient documentation

## 2024-03-31 DIAGNOSIS — R109 Unspecified abdominal pain: Secondary | ICD-10-CM | POA: Diagnosis not present

## 2024-03-31 DIAGNOSIS — R4182 Altered mental status, unspecified: Secondary | ICD-10-CM | POA: Insufficient documentation

## 2024-03-31 DIAGNOSIS — S299XXA Unspecified injury of thorax, initial encounter: Secondary | ICD-10-CM | POA: Diagnosis present

## 2024-03-31 LAB — COMPREHENSIVE METABOLIC PANEL WITH GFR
ALT: 6 U/L (ref 0–44)
AST: 15 U/L (ref 15–41)
Albumin: 3.8 g/dL (ref 3.5–5.0)
Alkaline Phosphatase: 49 U/L (ref 38–126)
Anion gap: 13 (ref 5–15)
BUN: 32 mg/dL — ABNORMAL HIGH (ref 8–23)
CO2: 21 mmol/L — ABNORMAL LOW (ref 22–32)
Calcium: 8.7 mg/dL — ABNORMAL LOW (ref 8.9–10.3)
Chloride: 106 mmol/L (ref 98–111)
Creatinine, Ser: 1.35 mg/dL — ABNORMAL HIGH (ref 0.44–1.00)
GFR, Estimated: 38 mL/min — ABNORMAL LOW (ref >60)
Glucose, Bld: 111 mg/dL — ABNORMAL HIGH (ref 70–99)
Potassium: 4.1 mmol/L (ref 3.5–5.1)
Sodium: 140 mmol/L (ref 135–145)
Total Bilirubin: 0.4 mg/dL (ref 0.0–1.2)
Total Protein: 6.3 g/dL — ABNORMAL LOW (ref 6.5–8.1)

## 2024-03-31 LAB — CBC
HCT: 35.7 % — ABNORMAL LOW (ref 36.0–46.0)
Hemoglobin: 10.6 g/dL — ABNORMAL LOW (ref 12.0–15.0)
MCH: 25.7 pg — ABNORMAL LOW (ref 26.0–34.0)
MCHC: 29.7 g/dL — ABNORMAL LOW (ref 30.0–36.0)
MCV: 86.4 fL (ref 80.0–100.0)
Platelets: 376 K/uL (ref 150–400)
RBC: 4.13 MIL/uL (ref 3.87–5.11)
RDW: 15.9 % — ABNORMAL HIGH (ref 11.5–15.5)
WBC: 10.4 K/uL (ref 4.0–10.5)
nRBC: 0 % (ref 0.0–0.2)

## 2024-03-31 LAB — URINALYSIS, COMPLETE (UACMP) WITH MICROSCOPIC
Bilirubin Urine: NEGATIVE
Glucose, UA: >=500 mg/dL — AB
Hgb urine dipstick: NEGATIVE
Ketones, ur: NEGATIVE mg/dL
Leukocytes,Ua: NEGATIVE
Nitrite: NEGATIVE
Protein, ur: NEGATIVE mg/dL
RBC / HPF: 0 RBC/hpf (ref 0–5)
Specific Gravity, Urine: 1.021 (ref 1.005–1.030)
pH: 5 (ref 5.0–8.0)

## 2024-03-31 LAB — LACTIC ACID, PLASMA: Lactic Acid, Venous: 1.5 mmol/L (ref 0.5–1.9)

## 2024-03-31 MED ORDER — HYDROCODONE-ACETAMINOPHEN 5-325 MG PO TABS: 1.0000 | ORAL_TABLET | Freq: Four times a day (QID) | ORAL | 0 refills | Status: AC | PRN

## 2024-03-31 MED ORDER — SODIUM CHLORIDE 0.9 % IV BOLUS
1000.0000 mL | Freq: Once | INTRAVENOUS | Status: AC
  Administered 2024-03-31: 1000 mL via INTRAVENOUS

## 2024-03-31 MED ORDER — HYDROCODONE-ACETAMINOPHEN 5-325 MG PO TABS
1.0000 | ORAL_TABLET | Freq: Once | ORAL | Status: AC
  Administered 2024-03-31: 1 via ORAL
  Filled 2024-03-31: qty 1

## 2024-03-31 NOTE — Discharge Instructions (Addendum)
 Please follow-up with your primary care doctor.  Please dose pain medication as needed but only as prescribed.  This medication will likely make the patient tired and possibly off balance.  Return to the emergency department for any trouble breathing or any other symptom personally concerning to yourself.

## 2024-03-31 NOTE — ED Notes (Signed)
 Patient transported to CT

## 2024-03-31 NOTE — ED Triage Notes (Signed)
 Pt arrives to ED from [facility] [place] via [facility] with C/C of altered mental status. Pt was being seen at the clinic for UTI which has been ongoing for the last 2 weeks. EMS reports transport vitals of 142/75, p89, r19, 96% on room air, CBG 125. At time of assessment, pt A&O x4 but with slow verbal response. Provider at bedside.

## 2024-03-31 NOTE — ED Provider Notes (Signed)
 [facility] Provider Note    Event Date/Time   First MD Initiated Contact with Patient 03/31/24 1757     (approximate)  History   Chief Complaint: Altered mental status  HPI  Briana Cook is a 89 y.o. female with a past medical history of arthritis, gastric reflux, hyperlipidemia, presents to the emergency department for altered mental status.  According to report patient is coming from [facility] where the patient was noted to be confused today (awaiting family arrival for further details).  Per EMS report they stated that the patient was recently treated for urinary tract infection and they swapped the antibiotic out to a different antibiotic and the patient was there for follow-up appointment where she had noted to be confused so I sent her to the emergency department for further workup.  Here the patient is awake and alert she is pleasant, but is confused, has trouble answering questions, gets easily distracted unable to tell me the date but was able to tell me her name and date of birth.  Awaiting family arrival for further history such as normal baseline  Physical Exam   Triage Vital Signs: ED Triage Vitals  Encounter Vitals Group     BP      Girls Systolic BP Percentile      Girls Diastolic BP Percentile      Boys Systolic BP Percentile      Boys Diastolic BP Percentile      Pulse      Resp      Temp      Temp src      SpO2      Weight      Height      Head Circumference      Peak Flow      Pain Score      Pain Loc      Pain Education      Exclude from Growth Chart     Most recent vital signs: There were no vitals filed for this visit.  General: Awake, no distress.  Patient is oriented to person and place not oriented to time.  Does seem to get distracted very easily during questioning has a hard time answering most questions. CV:  Good peripheral perfusion.  Regular rate and rhythm  Resp:  Normal effort.  Equal breath sounds  bilaterally.  Abd:  No distention.  Soft, nontender.    ED Results / Procedures / Treatments   EKG  EKG viewed and interpreted by myself shows atrial fibrillation at 80 bpm with a narrow QRS, normal axis, normal intervals, no concerning ST changes.  In reviewing the past EKGs this appears to be chronic.  RADIOLOGY  CT scan of the head shows no significant finding.  On my evaluation I do not appreciate any bleed. Radiology has read the CT scan as negative. CT of the abdomen and pelvis however shows displaced acute left posterior ninth rib fracture and nondisplaced 8th and 10th fractures.  Constipation.  MEDICATIONS ORDERED IN ED: Medications  sodium chloride  0.9 % bolus 1,000 mL (has no administration in time range)     IMPRESSION / MDM / ASSESSMENT AND PLAN / ED COURSE  I reviewed the triage vital signs and the nursing notes.  Patient's presentation is most consistent with acute presentation with potential threat to life or bodily function.  Patient presents emergency department for altered mental status.  Per EMS report patient was recently treated for urinary tract infection, was  seen back at the office today noted to be confused.  Here the patient overall appears well, no distress.  She is confused has difficulty answering questions.  We will check labs we will obtain a urine sample we will obtain a CT scan of the head and continue to closely monitor.  Awaiting family arrival for further history.  The son is now here with the patient states she has some dementia at baseline and will be confused at baseline at times as well.  What he has noticed more as the patient was complaining of some pain to her left abdomen/left flank today and then seem to get very upset at him earlier which he thought could be the dementia however he was concerned and wanted the pain evaluated as well as the increased confusion although states the confusion is fairly typical for her with her  dementia.  Patient's workup today in the emergency department shows a reassuring CBC with a normal white blood cell count.  Patient's chemistry shows no significant finding.  Lactic acid of 1.5.  Urinalysis shows no obvious urinary tract infection.  CT scan of the head has resulted negative for acute abnormality.  CT abdomen pelvis shows 3 rib fractures 1 of which is displaced.  Highly suspect the rib fractures as the cause of the patient's pain.  Son states the patient did have a fall over the weekend and somebody caught her to keep her from falling, which could have been the cause.  Given the patient's otherwise reassuring workup we will discharge from the emergency department with a pain medication prescription.  Some lives with the patient and will dose the medication as the patient needs.   FINAL CLINICAL IMPRESSION(S) / ED DIAGNOSES   Altered mental status/confusion Multiple rib fractures  Note:  This document was prepared using Dragon voice recognition software and may include unintentional dictation errors.   Dorothyann Drivers, MD 03/31/24 2136

## 2024-03-31 NOTE — ED Notes (Signed)
 When completing assessment, this patient added that she was being seen at [facility] for left flank pain resulting from a fall three days ago. She reports 8/10 pain. Pt is on eliquis . Provider notified.

## 2024-03-31 NOTE — ED Notes (Signed)
 Discharge paperwork given to pt son. No questions at this time. Max 1 assist to help pt get dressed and into the wheelchair. Pt unsteady on feet at baseline.
